# Patient Record
Sex: Male | Born: 1965 | Race: Black or African American | Hispanic: No | Marital: Married | Smoking: Never smoker
Health system: Southern US, Community
[De-identification: ages and names within clinical notes are randomized; demographics above are authoritative.]

## PROBLEM LIST (undated history)

## (undated) HISTORY — PX: HERNIA REPAIR: SHX51

---

## 2013-06-28 ENCOUNTER — Ambulatory Visit (INDEPENDENT_AMBULATORY_CARE_PROVIDER_SITE_OTHER): Admitting: *Deleted

## 2013-06-28 DIAGNOSIS — Z23 Encounter for immunization: Secondary | ICD-10-CM

## 2019-09-02 ENCOUNTER — Emergency Department (HOSPITAL_BASED_OUTPATIENT_CLINIC_OR_DEPARTMENT_OTHER)

## 2019-09-02 ENCOUNTER — Emergency Department (HOSPITAL_COMMUNITY)

## 2019-09-02 ENCOUNTER — Inpatient Hospital Stay (HOSPITAL_BASED_OUTPATIENT_CLINIC_OR_DEPARTMENT_OTHER)
Admission: EM | Admit: 2019-09-02 | Discharge: 2019-09-04 | DRG: 316 | Disposition: A | Discharge status: Home / Self Care | Attending: Cardiovascular Disease | Admitting: Cardiovascular Disease

## 2019-09-02 ENCOUNTER — Encounter (HOSPITAL_BASED_OUTPATIENT_CLINIC_OR_DEPARTMENT_OTHER): Payer: Self-pay | Admitting: *Deleted

## 2019-09-02 ENCOUNTER — Other Ambulatory Visit: Payer: Self-pay

## 2019-09-02 DIAGNOSIS — R10A2 Flank pain, left side: Secondary | ICD-10-CM

## 2019-09-02 DIAGNOSIS — I314 Cardiac tamponade: Secondary | ICD-10-CM

## 2019-09-02 DIAGNOSIS — I301 Infective pericarditis: Secondary | ICD-10-CM | POA: Diagnosis present

## 2019-09-02 DIAGNOSIS — I313 Pericardial effusion (noninflammatory): Secondary | ICD-10-CM | POA: Diagnosis present

## 2019-09-02 DIAGNOSIS — Z8619 Personal history of other infectious and parasitic diseases: Secondary | ICD-10-CM

## 2019-09-02 DIAGNOSIS — Z87442 Personal history of urinary calculi: Secondary | ICD-10-CM | POA: Diagnosis not present

## 2019-09-02 DIAGNOSIS — Z20822 Contact with and (suspected) exposure to covid-19: Secondary | ICD-10-CM | POA: Diagnosis present

## 2019-09-02 DIAGNOSIS — R109 Unspecified abdominal pain: Secondary | ICD-10-CM

## 2019-09-02 DIAGNOSIS — I3139 Other pericardial effusion (noninflammatory): Secondary | ICD-10-CM

## 2019-09-02 DIAGNOSIS — R9431 Abnormal electrocardiogram [ECG] [EKG]: Secondary | ICD-10-CM

## 2019-09-02 LAB — CBC WITH DIFFERENTIAL/PLATELET
Abs Immature Granulocytes: 0.02 10*3/uL (ref 0.00–0.07)
Basophils Absolute: 0 10*3/uL (ref 0.0–0.1)
Basophils Relative: 0 %
Eosinophils Absolute: 0 10*3/uL (ref 0.0–0.5)
Eosinophils Relative: 1 %
HCT: 34.6 % — ABNORMAL LOW (ref 39.0–52.0)
Hemoglobin: 11.1 g/dL — ABNORMAL LOW (ref 13.0–17.0)
Immature Granulocytes: 0 %
Lymphocytes Relative: 21 %
Lymphs Abs: 1.2 10*3/uL (ref 0.7–4.0)
MCH: 26.1 pg (ref 26.0–34.0)
MCHC: 32.1 g/dL (ref 30.0–36.0)
MCV: 81.2 fL (ref 80.0–100.0)
Monocytes Absolute: 1.1 10*3/uL — ABNORMAL HIGH (ref 0.1–1.0)
Monocytes Relative: 19 %
Neutro Abs: 3.4 10*3/uL (ref 1.7–7.7)
Neutrophils Relative %: 59 %
Platelets: 208 10*3/uL (ref 150–400)
RBC: 4.26 MIL/uL (ref 4.22–5.81)
RDW: 14.6 % (ref 11.5–15.5)
WBC: 5.8 10*3/uL (ref 4.0–10.5)
nRBC: 0 % (ref 0.0–0.2)

## 2019-09-02 LAB — COMPREHENSIVE METABOLIC PANEL
ALT: 37 U/L (ref 0–44)
AST: 27 U/L (ref 15–41)
Albumin: 3.3 g/dL — ABNORMAL LOW (ref 3.5–5.0)
Alkaline Phosphatase: 82 U/L (ref 38–126)
Anion gap: 9 (ref 5–15)
BUN: 12 mg/dL (ref 6–20)
CO2: 23 mmol/L (ref 22–32)
Calcium: 8.6 mg/dL — ABNORMAL LOW (ref 8.9–10.3)
Chloride: 101 mmol/L (ref 98–111)
Creatinine, Ser: 0.97 mg/dL (ref 0.61–1.24)
GFR calc Af Amer: >60 mL/min (ref >60)
GFR calc non Af Amer: >60 mL/min (ref >60)
Glucose, Bld: 94 mg/dL (ref 70–99)
Potassium: 4.2 mmol/L (ref 3.5–5.1)
Sodium: 133 mmol/L — ABNORMAL LOW (ref 135–145)
Total Bilirubin: 0.7 mg/dL (ref 0.3–1.2)
Total Protein: 7.1 g/dL (ref 6.5–8.1)

## 2019-09-02 LAB — SARS CORONAVIRUS 2 BY RT PCR (HOSPITAL ORDER, PERFORMED IN ~~LOC~~ HOSPITAL LAB): SARS Coronavirus 2: NEGATIVE

## 2019-09-02 LAB — C-REACTIVE PROTEIN: CRP: 22.5 mg/dL — ABNORMAL HIGH (ref <1.0)

## 2019-09-02 LAB — LACTIC ACID, PLASMA: Lactic Acid, Venous: 1.2 mmol/L (ref 0.5–1.9)

## 2019-09-02 LAB — LIPASE, BLOOD: Lipase: 15 U/L (ref 11–51)

## 2019-09-02 LAB — SEDIMENTATION RATE: Sed Rate: 69 mm/hr — ABNORMAL HIGH (ref 0–16)

## 2019-09-02 LAB — HIV ANTIBODY (ROUTINE TESTING W REFLEX): HIV Screen 4th Generation wRfx: NONREACTIVE

## 2019-09-02 LAB — ECHOCARDIOGRAM COMPLETE
Height: 69 in
Weight: 2944 oz

## 2019-09-02 LAB — TROPONIN I (HIGH SENSITIVITY): Troponin I (High Sensitivity): 26 ng/L — ABNORMAL HIGH (ref <18)

## 2019-09-02 MED ORDER — IBUPROFEN 200 MG PO TABS
800.0000 mg | ORAL_TABLET | Freq: Three times a day (TID) | ORAL | Status: DC
  Administered 2019-09-02 – 2019-09-04 (×3): 800 mg via ORAL
  Filled 2019-09-02: qty 4
  Filled 2019-09-02: qty 1
  Filled 2019-09-02: qty 4

## 2019-09-02 MED ORDER — COLCHICINE 0.6 MG PO TABS
0.6000 mg | ORAL_TABLET | Freq: Two times a day (BID) | ORAL | Status: DC
  Administered 2019-09-02 – 2019-09-04 (×3): 0.6 mg via ORAL
  Filled 2019-09-02 (×3): qty 1

## 2019-09-02 MED ORDER — SODIUM CHLORIDE 0.9% FLUSH
3.0000 mL | Freq: Two times a day (BID) | INTRAVENOUS | Status: DC
  Administered 2019-09-02 – 2019-09-03 (×2): 3 mL via INTRAVENOUS

## 2019-09-02 MED ORDER — PANTOPRAZOLE SODIUM 40 MG PO TBEC
40.0000 mg | DELAYED_RELEASE_TABLET | Freq: Every day | ORAL | Status: DC
  Administered 2019-09-02 – 2019-09-04 (×2): 40 mg via ORAL
  Filled 2019-09-02 (×2): qty 1

## 2019-09-02 NOTE — ED Notes (Signed)
ED Provider at bedside. 

## 2019-09-02 NOTE — Consult Note (Addendum)
Cardiology Consultation:   Patient ID: Craig Benjamin MRN: [id number]; DOB: 1965-10-24  Admit date: 09/02/2019 Date of Consult: 09/02/2019  Primary Care Provider: Gillian Scarce, MD Primary Cardiologist: No primary care provider on file. New Primary Electrophysiologist:  None    Patient Profile:   Craig Benjamin is a 54 y.o. male with a hx of generally good health who is being seen today for the evaluation of pericarditis at the request of Dr. Freida Busman.  History of Present Illness:   Craig Benjamin began experiencing symptoms suggestive of a viral illness (low-grade elevation in temperature, body aches) approximately 4-5 days ago.  He did not have a history of COVID-19 exposure and his Covid test was negative.  Later he began developing an aching discomfort in his lower posterior left thorax worsened by lying flat in bed or by taking a very deep breath.  It was vaguely reminiscent of previous problems with nephrolithiasis so he went to have an abdominal CT.  This did not show any evidence of nephrolithiasis but reportedly showed presence of a pericardial effusion (the study was performed at [facility] and cannot be reviewed).  An ECG was performed at [facility] and shows 2 mm ST segment elevation in virtually all ECG leads, strongly suggestive of acute pericarditis.  He has not had cough, shaking chills, hemoptysis, leg swelling, syncope or presyncope, palpitations, dizziness or lightheadedness, history of chest trauma or gastrointestinal symptoms.  He does not have any other symptoms to suggest autoimmune or collagen vascular disease.  He has noted recent shortness of breath and thinks it might be a little difficult to climb a flight of stairs, weather is otherwise he is in good physical shape.  He lives a very healthy lifestyle with a healthy diet and exercises 6 days a week (3 days cardio, 3 days weights).  Heart Pathway Score:     History reviewed. No pertinent past medical history.  Kidney stone 2018. He has a history of chickenpox, mildly elevated PSA, HSV-2 infection.  Past Surgical History:  Procedure Laterality Date  . HERNIA REPAIR       Home Medications:  Prior to Admission medications   Medication Sig Start Date End Date Taking? Authorizing Provider  sulfamethoxazole-trimethoprim (BACTRIM DS) 800-160 MG tablet Take 1 tablet by mouth 2 (two) times daily.   Yes [provider]    Inpatient Medications: Scheduled Meds:  Continuous Infusions:  PRN Meds:   Allergies:   No Known Allergies  Social History:   Social History   Socioeconomic History  . Marital status: Married    Spouse name: Not on file  . Number of children: Not on file  . Years of education: Not on file  . Highest education level: Not on file  Occupational History  . Not on file  Tobacco Use  . Smoking status: Never Smoker  . Smokeless tobacco: Never Used  Substance and Sexual Activity  . Alcohol use: Not Currently    Comment: occ  . Drug use: Not on file  . Sexual activity: Not on file  Other Topics Concern  . Not on file  Social History Narrative  . Not on file   Social Determinants of Health   Financial Resource Strain:   . Difficulty of Paying Living Expenses: Not on file  Food Insecurity:   . Worried About Programme researcher, broadcasting/film/video in the Last Year: Not on file  . Ran Out of Food in the Last Year: Not on file  Transportation Needs:   .  Lack of Transportation (Medical): Not on file  . Lack of Transportation (Non-Medical): Not on file  Physical Activity:   . Days of Exercise per Week: Not on file  . Minutes of Exercise per Session: Not on file  Stress:   . Feeling of Stress : Not on file  Social Connections:   . Frequency of Communication with Friends and Family: Not on file  . Frequency of Social Gatherings with Friends and Family: Not on file  . Attends Religious Services: Not on file  . Active Member of Clubs or Organizations: Not on file  . Attends Occupational hygienist Meetings: Not on file  . Marital Status: Not on file  Intimate Partner Violence:   . Fear of Current or Ex-Partner: Not on file  . Emotionally Abused: Not on file  . Physically Abused: Not on file  . Sexually Abused: Not on file    Family History:   There is no family history of premature onset cardiac illness   ROS:  Please see the history of present illness.   All other ROS reviewed and negative.     Physical Exam/Data:   Vitals:   09/02/19 1540 09/02/19 1610 09/02/19 1719 09/02/19 1723  BP: (!) 124/95 (!) 123/99 (!) 131/98   Pulse: 94 99 95 96  Resp: (!) 24 (!) 23  (!) 29  Temp:      TempSrc:      SpO2: 96% 99% 100% 100%  Weight:      Height:       No intake or output data in the 24 hours ending 09/02/19 1842 Last 3 Weights 09/02/2019  Weight (lbs) 184 lb  Weight (kg) 83.462 kg     Body mass index is 27.17 kg/m.  General:  Well nourished, well developed, in no acute distress, he is minimally overweight, also appears fairly muscular and fit HEENT: normal Lymph: no adenopathy Neck: no JVD Endocrine:  No thryomegaly Vascular: No carotid bruits; FA pulses 2+ bilaterally without bruits  Cardiac:  normal S1, S2; RRR; no murmur no pericardial rub is heard Lungs:  clear to auscultation bilaterally, no wheezing, rhonchi or rales  Abd: soft, nontender, no hepatomegaly  Ext: no edema Musculoskeletal:  No deformities, BUE and BLE strength normal and equal Skin: warm and dry  Neuro:  CNs 2-12 intact, no focal abnormalities noted Psych:  Normal affect   EKG:  The EKG was personally reviewed and demonstrates: Sinus rhythm and diffuse ST segment elevation, upwardly concave, consistent with acute pericarditis Telemetry:  Telemetry was personally reviewed and demonstrates: Sinus rhythm  Relevant CV Studies: Echo has been ordered and is pending  Laboratory Data:  High Sensitivity Troponin:   Recent Labs  Lab 09/02/19 1545  TROPONINIHS 26*      Chemistry Recent Labs  Lab 09/02/19 1545  NA 133*  K 4.2  CL 101  CO2 23  GLUCOSE 94  BUN 12  CREATININE 0.97  CALCIUM 8.6*  GFRNONAA >60  GFRAA >60  ANIONGAP 9    Recent Labs  Lab 09/02/19 1545  PROT 7.1  ALBUMIN 3.3*  AST 27  ALT 37  ALKPHOS 82  BILITOT 0.7   Hematology Recent Labs  Lab 09/02/19 1545  WBC 5.8  RBC 4.26  HGB 11.1*  HCT 34.6*  MCV 81.2  MCH 26.1  MCHC 32.1  RDW 14.6  PLT 208   BNPNo results for input(s): BNP, PROBNP in the last 168 hours.  DDimer No results for input(s): DDIMER in  the last 168 hours.   Radiology/Studies:  DG Chest Port 1 View  Result Date: 09/02/2019 CLINICAL DATA:  Pericardial effusion.  Fever and body aches. EXAM: PORTABLE CHEST 1 VIEW COMPARISON:  None. FINDINGS: The cardiac silhouette is enlarged. Opacity in left retrocardiac region obscures the left hemidiaphragm. The hila and mediastinum are normal. No pneumothorax. No nodules or masses. No other acute abnormalities. IMPRESSION: The cardiac silhouette is enlarged which could represent cardiomegaly or a pericardial effusion given history. Opacity in left retrocardiac region may simply represent atelectasis. A PA and lateral chest x-ray could better evaluate. Electronically Signed   By: Dorise Bullion III M.D   On: 09/02/2019 16:19   {  Assessment and Plan:   1. Acute pericarditis: Based on prodrome, this is very likely to be viral pericarditis and will have a self-limited course.  We will assess the quantity of pericardial fluid and any possible hemodynamic consequences with a stat echocardiogram.  If there is no evidence of hemodynamic risk, will treat with NSAIDs and colchicine.  If he has a large pericardial effusions with signs of impaired cardiac filling, will admit to the hospital.  We will avoid NSAIDs if there is evidence of regional wall motion abnormalities that could suggest myocarditis, but this is unlikely with normal cardiac enzymes.   ADDENDUM: Bedside  review of the echocardiogram shows a very large circumferential pericardial effusion with multiple echocardiographic signs of tamponade (right ventricular early diastolic collapse, marked increase in respiratory variation in the mitral and tricuspid inflow, dilated inferior vena cava).  He remains hemodynamically compensated with normal blood pressure and only mild resting tachycardia.  There is no indication for immediate pericardiocentesis, but I warned him that this might be decided upon tomorrow.  I am particularly worried that he is only been ill for about 4 to 5 days which means the effusion has accumulated very quickly and is likely to continue enlarging fast.  Briefly discussed the pros and cons of pericardiocentesis.     For questions or updates, please contact [place] Please consult [email or web address] for contact info under     Signed, Sanda Klein, MD  09/02/2019 6:42 PM

## 2019-09-02 NOTE — Progress Notes (Signed)
  Echocardiogram 2D Echocardiogram has been performed.  Delcie Roch 09/02/2019, 7:25 PM

## 2019-09-02 NOTE — ED Notes (Signed)
Cardiology at bedside.

## 2019-09-02 NOTE — ED Notes (Signed)
Report to [facility] by [facility] RN.

## 2019-09-02 NOTE — ED Provider Notes (Signed)
Patient transfer from [facility] due to the pericardial effusion.  He has been seen by cardiology.  Spoke with cardiology here and they will see him.  He is stable at this point.   Lorre Nick, MD 09/02/19 1729

## 2019-09-02 NOTE — ED Provider Notes (Addendum)
[facility] Emergency Department Provider Note MRN:  [id number]  Arrival date & time: 09/02/19     Chief Complaint   Flank pain History of Present Illness   Craig Benjamin is a 54 y.o. year-old male with no pertinent past medical history presenting to the ED with chief complaint of flank pain.  Patient began experiencing fever, body aches about 5 days ago, thought he had COVID-19.  He was tested for Covid and it was negative.  He then began experiencing more localized left flank pain that he thought felt similar to her prior kidney stone.  He went to urology today, who performed CT scan as an outpatient which revealed pericardial effusion.  He is advised to come to the emergency department for evaluation.  Review of Systems  A complete 10 system review of systems was obtained and all systems are negative except as noted in the HPI and PMH.   Patient's Health History   History reviewed. No pertinent past medical history.  Past Surgical History:  Procedure Laterality Date  . HERNIA REPAIR      No family history on file.  Social History   Socioeconomic History  . Marital status: Married    Spouse name: Not on file  . Number of children: Not on file  . Years of education: Not on file  . Highest education level: Not on file  Occupational History  . Not on file  Tobacco Use  . Smoking status: Never Smoker  . Smokeless tobacco: Never Used  Substance and Sexual Activity  . Alcohol use: Not Currently    Comment: occ  . Drug use: Not on file  . Sexual activity: Not on file  Other Topics Concern  . Not on file  Social History Narrative  . Not on file   Social Determinants of Health   Financial Resource Strain:   . Difficulty of Paying Living Expenses: Not on file  Food Insecurity:   . Worried About Programme researcher, broadcasting/film/video in the Last Year: Not on file  . Ran Out of Food in the Last Year: Not on file  Transportation Needs:   . Lack of  Transportation (Medical): Not on file  . Lack of Transportation (Non-Medical): Not on file  Physical Activity:   . Days of Exercise per Week: Not on file  . Minutes of Exercise per Session: Not on file  Stress:   . Feeling of Stress : Not on file  Social Connections:   . Frequency of Communication with Friends and Family: Not on file  . Frequency of Social Gatherings with Friends and Family: Not on file  . Attends Religious Services: Not on file  . Active Member of Clubs or Organizations: Not on file  . Attends Banker Meetings: Not on file  . Marital Status: Not on file  Intimate Partner Violence:   . Fear of Current or Ex-Partner: Not on file  . Emotionally Abused: Not on file  . Physically Abused: Not on file  . Sexually Abused: Not on file     Physical Exam  Vital Signs and Nursing Notes reviewed Vitals:   09/02/19 1522 09/02/19 1540  BP: (!) 144/97 (!) 124/95  Pulse: 98 94  Resp: 18 (!) 24  Temp: 99.1 F (37.3 C)   SpO2: 99% 96%    CONSTITUTIONAL: Well-appearing, NAD NEURO:  Alert and oriented x 3, no focal deficits EYES:  eyes equal and reactive ENT/NECK:  no LAD, no JVD CARDIO: Regular  rate, well-perfused, normal S1 and S2 PULM:  CTAB no wheezing or rhonchi GI/GU:  normal bowel sounds, non-distended, non-tender MSK/SPINE:  No gross deformities, no edema SKIN:  no rash, atraumatic PSYCH:  Appropriate speech and behavior  Diagnostic and Interventional Summary    EKG Interpretation  Date/Time:  Monday September 02 2019 15:31:42 EST Ventricular Rate:  95 PR Interval:    QRS Duration: 97 QT Interval:  333 QTC Calculation: 419 R Axis:   79 Text Interpretation: Sinus rhythm diffuse st elevation No previous ECGs available Confirmed by Gerlene Fee [phone]) on 09/02/2019 4:03:50 PM      Labs Reviewed  CBC WITH DIFFERENTIAL/PLATELET - Abnormal; Notable for the following components:      Result Value   Hemoglobin 11.1 (*)    HCT 34.6 (*)     Monocytes Absolute 1.1 (*)    All other components within normal limits  COMPREHENSIVE METABOLIC PANEL  LACTIC ACID, PLASMA  LIPASE, BLOOD  TROPONIN I (HIGH SENSITIVITY)    DG Chest Port 1 View    (Results Pending)    Medications - No data to display   Procedures  /  Critical Care .Critical Care Performed by: Maudie Flakes, MD Authorized by: Maudie Flakes, MD   Critical care provider statement:    Critical care time (minutes):  33   Critical care was necessary to treat or prevent imminent or life-threatening deterioration of the following conditions:  Cardiac failure (Pericardial effusion with concern for tamponade physiology)   Critical care was time spent personally by me on the following activities:  Discussions with consultants, evaluation of patient's response to treatment, examination of patient, ordering and performing treatments and interventions, ordering and review of laboratory studies, ordering and review of radiographic studies, pulse oximetry, re-evaluation of patient's condition, obtaining history from patient or surrogate and review of old charts Ultrasound ED Abd  Date/Time: 09/02/2019 4:04 PM Performed by: Maudie Flakes, MD Authorized by: Maudie Flakes, MD   Procedure details:    Indications: flank pain     Scope of abdominal ultrasound: to evaluate for evidence of dissection or aneurysm.   Aorta:  Visualized   Images: archived    Vascular findings:    Aorta: aorta normal (< 3cm)   Comments:     Normal appearing aorta Ultrasound ED Echo  Date/Time: 09/02/2019 4:05 PM Performed by: Maudie Flakes, MD Authorized by: Maudie Flakes, MD   Procedure details:    Indications comment:  Flank pain   Views: parasternal long axis view, parasternal short axis view and apical 4 chamber view     Images: archived   Findings:    Pericardium: large pericardial effusion and RV collapse/tamponade     LV Function: normal (>50% EF)   Impression:    Impression:  pericardial effusion present      ED Course and Medical Decision Making  I have reviewed the triage vital signs, the nursing notes, and pertinent available records from the EMR.  Pertinent labs & imaging results that were available during my care of the patient were reviewed by me and considered in my medical decision making (see below for details).     Patient sent here with concern for pericardial effusion.  He has normal vital signs and is well-appearing in no acute distress, initially denied having any chest pain.  Initial EKG demonstrating diffuse ST elevations.  Cardiology immediately paged for consultation.  Felt that this was unlikely to be a traditional STEMI given the  lack of chest pain and evidence of pericardial effusion.  Effusion was confirmed on bedside ultrasound, see note above.  There was concern for RV collapse or trampoline sign during my evaluation.  Discussed with Dr. Clifton James cardiology, who agrees with avoiding STEMI initiation but does want ER to ER transfer to [facility] for emergent cardiology evaluation, question need for formal echo versus pericardiocentesis versus cardiac cath.  Had also considered CTA of the chest and abdomen to exclude aortic pathology, discussed this option with cardiology, will defer testing to providers at [facility] given her ability to rapidly transport.  Accepting ER physician Dr. Renaye Rakers.    Elmer Sow. Pilar Plate, MD [facility] Emergency Medicine [facility] [email or web address]  Final Clinical Impressions(s) / ED Diagnoses     ICD-10-CM   1. Pericardial effusion  I31.3   2. Abnormal EKG  R94.31   3. Left flank pain  R10.9     ED Discharge Orders    None       Discharge Instructions Discussed with and Provided to Patient:   Discharge Instructions   None       Sabas Sous, MD 09/02/19 1608    Sabas Sous, MD 09/02/19 [phone]

## 2019-09-02 NOTE — ED Triage Notes (Signed)
Pt c/o fever body aches x 5 days , seen % days ago for same Covid neg, CT flank this am , sent here for abs reading " fluid on heart"

## 2019-09-02 NOTE — ED Triage Notes (Signed)
Pt arrives to ED from [facility] with [facility] with complaints of an outpatient CT scan that reveled a pericardial effusion and confirmed with bedside ultrasound by MD Bero. Patient denies chest pain or shortness of breath.

## 2019-09-03 ENCOUNTER — Inpatient Hospital Stay (HOSPITAL_COMMUNITY)

## 2019-09-03 ENCOUNTER — Inpatient Hospital Stay (HOSPITAL_COMMUNITY)
Admission: EM | Disposition: A | Discharge status: Home / Self Care | Payer: Self-pay | Source: Home / Self Care | Attending: Cardiovascular Disease

## 2019-09-03 DIAGNOSIS — I314 Cardiac tamponade: Secondary | ICD-10-CM

## 2019-09-03 DIAGNOSIS — I313 Pericardial effusion (noninflammatory): Secondary | ICD-10-CM

## 2019-09-03 HISTORY — PX: PERICARDIOCENTESIS: CATH118255

## 2019-09-03 LAB — BODY FLUID CELL COUNT WITH DIFFERENTIAL
Eos, Fluid: 1 %
Lymphs, Fluid: 31 %
Monocyte-Macrophage-Serous Fluid: 14 % — ABNORMAL LOW (ref 50–90)
Neutrophil Count, Fluid: 54 % — ABNORMAL HIGH (ref 0–25)
Total Nucleated Cell Count, Fluid: 628 cu mm (ref 0–1000)

## 2019-09-03 LAB — MRSA PCR SCREENING: MRSA by PCR: NEGATIVE

## 2019-09-03 LAB — GRAM STAIN: Gram Stain: NONE SEEN

## 2019-09-03 LAB — ECHOCARDIOGRAM LIMITED
Height: 69 in
Weight: 2926.4 oz

## 2019-09-03 SURGERY — PERICARDIOCENTESIS
Anesthesia: LOCAL

## 2019-09-03 MED ORDER — MIDAZOLAM HCL 2 MG/2ML IJ SOLN
INTRAMUSCULAR | Status: AC
  Filled 2019-09-03: qty 2

## 2019-09-03 MED ORDER — FENTANYL CITRATE (PF) 100 MCG/2ML IJ SOLN
INTRAMUSCULAR | Status: AC
  Filled 2019-09-03: qty 2

## 2019-09-03 MED ORDER — SODIUM CHLORIDE 0.9% FLUSH: 3.0000 mL | Freq: Two times a day (BID) | INTRAVENOUS | Status: DC

## 2019-09-03 MED ORDER — LIDOCAINE HCL (PF) 1 % IJ SOLN
INTRAMUSCULAR | Status: AC
  Filled 2019-09-03: qty 30

## 2019-09-03 MED ORDER — SODIUM CHLORIDE 0.9 % IV SOLN: 250.0000 mL | INTRAVENOUS | Status: DC | PRN

## 2019-09-03 MED ORDER — FENTANYL CITRATE (PF) 100 MCG/2ML IJ SOLN
INTRAMUSCULAR | Status: DC | PRN
  Administered 2019-09-03 (×2): 25 ug via INTRAVENOUS
  Administered 2019-09-03 (×2): 50 ug via INTRAVENOUS

## 2019-09-03 MED ORDER — SODIUM CHLORIDE 0.9 % IV SOLN
INTRAVENOUS | Status: AC | PRN
  Administered 2019-09-03: 10 mL/h via INTRAVENOUS

## 2019-09-03 MED ORDER — LIDOCAINE HCL (PF) 1 % IJ SOLN
INTRAMUSCULAR | Status: DC | PRN
  Administered 2019-09-03: 30 mL

## 2019-09-03 MED ORDER — OXYCODONE-ACETAMINOPHEN 5-325 MG PO TABS
1.0000 | ORAL_TABLET | Freq: Four times a day (QID) | ORAL | Status: DC | PRN
  Administered 2019-09-03: 2 via ORAL
  Filled 2019-09-03: qty 2

## 2019-09-03 MED ORDER — CHLORHEXIDINE GLUCONATE CLOTH 2 % EX PADS
6.0000 | MEDICATED_PAD | Freq: Every day | CUTANEOUS | Status: DC
  Administered 2019-09-03: 6 via TOPICAL

## 2019-09-03 MED ORDER — SODIUM CHLORIDE 0.9% FLUSH: 3.0000 mL | INTRAVENOUS | Status: DC | PRN

## 2019-09-03 MED ORDER — MORPHINE SULFATE (PF) 2 MG/ML IV SOLN
2.0000 mg | INTRAVENOUS | Status: DC | PRN
  Administered 2019-09-04: 2 mg via INTRAVENOUS
  Filled 2019-09-03: qty 1

## 2019-09-03 MED ORDER — MORPHINE SULFATE (PF) 10 MG/ML IV SOLN: 2.0000 mg | INTRAVENOUS | Status: DC | PRN

## 2019-09-03 MED ORDER — MIDAZOLAM HCL 2 MG/2ML IJ SOLN
INTRAMUSCULAR | Status: DC | PRN
  Administered 2019-09-03: 1 mg via INTRAVENOUS

## 2019-09-03 MED ORDER — MIDAZOLAM HCL 2 MG/2ML IJ SOLN
INTRAMUSCULAR | Status: DC | PRN
  Administered 2019-09-03: 1 mg via INTRAVENOUS
  Administered 2019-09-03: 2 mg via INTRAVENOUS

## 2019-09-03 MED ORDER — HYDRALAZINE HCL 20 MG/ML IJ SOLN: 10.0000 mg | INTRAMUSCULAR | Status: AC | PRN

## 2019-09-03 MED ORDER — HEPARIN (PORCINE) IN NACL 1000-0.9 UT/500ML-% IV SOLN
INTRAVENOUS | Status: DC | PRN
  Administered 2019-09-03: 500 mL

## 2019-09-03 SURGICAL SUPPLY — 2 items
PACK CARDIAC CATHETERIZATION (CUSTOM PROCEDURE TRAY) ×1 IMPLANT
PERIVAC PERICARDIOCENTESIS 8.3 (TRAY / TRAY PROCEDURE) ×1 IMPLANT

## 2019-09-03 NOTE — Plan of Care (Signed)

## 2019-09-03 NOTE — H&P (Signed)
Please review 09/03/2019 H&P labeled "consult note".   Cardiology Consultation:  Patient ID: Craig Benjamin  MRN: [id number]; DOB: 06/28/1966  Admit date: 09/02/2019  Date of Consult: 09/02/2019  Primary Care Provider: Jonathon Resides, MD  Primary Cardiologist: No primary care provider on file. New  Primary Electrophysiologist: None  Patient Profile:  Craig Benjamin is a 54 y.o. male with a hx of generally good health who is being seen today for the evaluation of pericarditis at the request of Dr. Zenia Resides.  History of Present Illness:  Mr. Boivin began experiencing symptoms suggestive of a viral illness (low-grade elevation in temperature, body aches) approximately 4-5 days ago. He did not have a history of COVID-19 exposure and his Covid test was negative. Later he began developing an aching discomfort in his lower posterior left thorax worsened by lying flat in bed or by taking a very deep breath. It was vaguely reminiscent of previous problems with nephrolithiasis so he went to have an abdominal CT. This did not show any evidence of nephrolithiasis but reportedly showed presence of a pericardial effusion (the study was performed at [facility] and cannot be reviewed). An ECG was performed at [facility] and shows 2 mm ST segment elevation in virtually all ECG leads, strongly suggestive of acute pericarditis.  He has not had cough, shaking chills, hemoptysis, leg swelling, syncope or presyncope, palpitations, dizziness or lightheadedness, history of chest trauma or gastrointestinal symptoms. He does not have any other symptoms to suggest autoimmune or collagen vascular disease.  He has noted recent shortness of breath and thinks it might be a little difficult to climb a flight of stairs, weather is otherwise he is in good physical shape.  He lives a very healthy lifestyle with a healthy diet and exercises 6 days a week (3 days cardio, 3 days weights).  Heart Pathway Score:  History reviewed. No  pertinent past medical history. Kidney stone 2018. He has a history of chickenpox, mildly elevated PSA, HSV-2 infection.       Past Surgical History:  Procedure Laterality Date  . HERNIA REPAIR     Home Medications:         Prior to Admission medications   Medication Sig Start Date End Date Taking? Authorizing Provider  sulfamethoxazole-trimethoprim (BACTRIM DS) 800-160 MG tablet Take 1 tablet by mouth 2 (two) times daily.   Yes [provider]  Inpatient Medications:  Scheduled Meds:  Continuous Infusions:  PRN Meds:  Allergies: No Known Allergies  Social History:  Social History        Socioeconomic History  . Marital status: Married    Spouse name: Not on file  . Number of children: Not on file  . Years of education: Not on file  . Highest education level: Not on file  Occupational History  . Not on file  Tobacco Use  . Smoking status: Never Smoker  . Smokeless tobacco: Never Used  Substance and Sexual Activity  . Alcohol use: Not Currently    Comment: occ  . Drug use: Not on file  . Sexual activity: Not on file  Other Topics Concern  . Not on file  Social History Narrative  . Not on file   Social Determinants of Health      Financial Resource Strain:   . Difficulty of Paying Living Expenses: Not on file  Food Insecurity:   . Worried About Charity fundraiser in the Last Year: Not on file  . Ran Out of Food  in the Last Year: Not on file  Transportation Needs:   . Lack of Transportation (Medical): Not on file  . Lack of Transportation (Non-Medical): Not on file  Physical Activity:   . Days of Exercise per Week: Not on file  . Minutes of Exercise per Session: Not on file  Stress:   . Feeling of Stress : Not on file  Social Connections:   . Frequency of Communication with Friends and Family: Not on file  . Frequency of Social Gatherings with Friends and Family: Not on file  . Attends Religious Services: Not on file  . Active Member of Clubs or  Organizations: Not on file  . Attends Banker Meetings: Not on file  . Marital Status: Not on file  Intimate Partner Violence:   . Fear of Current or Ex-Partner: Not on file  . Emotionally Abused: Not on file  . Physically Abused: Not on file  . Sexually Abused: Not on file   Family History:  There is no family history of premature onset cardiac illness  ROS:  Please see the history of present illness.  All other ROS reviewed and negative.  Physical Exam/Data:         Vitals:   09/02/19 1540 09/02/19 1610 09/02/19 1719 09/02/19 1723  BP: (!) 124/95 (!) 123/99 (!) 131/98   Pulse: 94 99 95 96  Resp: (!) 24 (!) 23  (!) 29  Temp:      TempSrc:      SpO2: 96% 99% 100% 100%  Weight:      Height:       No intake or output data in the 24 hours ending 09/02/19 1842  Last 3 Weights 09/02/2019  Weight (lbs) 184 lb  Weight (kg) 83.462 kg   Body mass index is 27.17 kg/m.  General: Well nourished, well developed, in no acute distress, he is minimally overweight, also appears fairly muscular and fit  HEENT: normal  Lymph: no adenopathy  Neck: no JVD  Endocrine: No thryomegaly  Vascular: No carotid bruits; FA pulses 2+ bilaterally without bruits  Cardiac: normal S1, S2; RRR; no murmur no pericardial rub is heard  Lungs: clear to auscultation bilaterally, no wheezing, rhonchi or rales  Abd: soft, nontender, no hepatomegaly  Ext: no edema  Musculoskeletal: No deformities, BUE and BLE strength normal and equal  Skin: warm and dry  Neuro: CNs 2-12 intact, no focal abnormalities noted  Psych: Normal affect  EKG: The EKG was personally reviewed and demonstrates: Sinus rhythm and diffuse ST segment elevation, upwardly concave, consistent with acute pericarditis  Telemetry: Telemetry was personally reviewed and demonstrates: Sinus rhythm  Relevant CV Studies:  Echo has been ordered and is pending  Laboratory Data:  High Sensitivity Troponin:  Last Labs                   Chemistry  Last Labs                                                           Last Labs                                      Hematology  Last Labs  BNP  Last Labs     DDimer  Last Labs     Radiology/Studies:  DG Chest Port 1 View  Result Date: 09/02/2019  CLINICAL DATA: Pericardial effusion. Fever and body aches. EXAM: PORTABLE CHEST 1 VIEW COMPARISON: None. FINDINGS: The cardiac silhouette is enlarged. Opacity in left retrocardiac region obscures the left hemidiaphragm. The hila and mediastinum are normal. No pneumothorax. No nodules or masses. No other acute abnormalities. IMPRESSION: The cardiac silhouette is enlarged which could represent cardiomegaly or a pericardial effusion given history. Opacity in left retrocardiac region may simply represent atelectasis. A PA and lateral chest x-ray could better evaluate. Electronically Signed By: Gerome Sam III M.D On: 09/02/2019 16:19   {  Assessment and Plan:  1. Acute pericarditis: Based on prodrome, this is very likely to be viral pericarditis and will have a self-limited course. We will assess the quantity of pericardial fluid and any possible hemodynamic consequences with a stat echocardiogram. If there is no evidence of hemodynamic risk, will treat with NSAIDs and colchicine. If he has a large pericardial effusions with signs of impaired cardiac filling, will admit to the hospital. We will avoid NSAIDs if there is evidence of regional wall motion abnormalities that could suggest myocarditis, but this is unlikely with normal cardiac enzymes. ADDENDUM: Bedside review of the echocardiogram shows a very large circumferential pericardial effusion with multiple echocardiographic signs of tamponade (right ventricular early diastolic collapse, marked increase in respiratory variation in the mitral and tricuspid inflow, dilated inferior vena cava).  He remains  hemodynamically compensated with normal blood pressure and only mild resting tachycardia. There is no indication for immediate pericardiocentesis, but I warned him that this might be decided upon tomorrow. I am particularly worried that he is only been ill for about 4 to 5 days which means the effusion has accumulated very quickly and is likely to continue enlarging fast. Briefly discussed the pros and cons of pericardiocentesis.  For questions or updates, please contact [facility]  Please consult [email or web address] for contact info under  Signed,  Thurmon Fair, MD  09/02/2019 6:42 PM

## 2019-09-03 NOTE — Progress Notes (Signed)
2D Echocardiogram has been performed.  Craig Benjamin Amariah Kierstead 09/03/2019, 8:16 AM

## 2019-09-03 NOTE — Progress Notes (Addendum)
Progress Note  Patient Name: Craig Benjamin Date of Encounter: 09/03/2019  Primary Cardiologist: New to [facility]  Subjective   Pt feeling ok today. Pain with laying flat. Repeat echocardiogram performed at bedside this AM.   Inpatient Medications    Scheduled Meds: . colchicine  0.6 mg Oral BID  . ibuprofen  800 mg Oral TID  . pantoprazole  40 mg Oral Daily  . sodium chloride flush  3 mL Intravenous Q12H   Continuous Infusions:  PRN Meds:   Vital Signs    Vitals:   09/02/19 2000 09/02/19 2030 09/02/19 2054 09/03/19 0416  BP:  128/86 135/77 122/83  Pulse: 91  96   Resp: (!) 28 (!) 21  16  Temp:   98.7 F (37.1 C) 98.4 F (36.9 C)  TempSrc:   Oral Oral  SpO2: 97%  100% 100%  Weight:   84 kg 83 kg  Height:        Intake/Output Summary (Last 24 hours) at 09/03/2019 0741 Last data filed at 09/02/2019 2118 Gross per 24 hour  Intake 243 ml  Output --  Net 243 ml   Filed Weights   09/02/19 1520 09/02/19 2054 09/03/19 0416  Weight: 83.5 kg 84 kg 83 kg    Physical Exam   General: Well developed, well nourished, NAD Neck: Negative for carotid bruits. No JVD Lungs:Clear to ausculation bilaterally. No wheezes. Breathing is unlabored. Cardiovascular: RRR with S1 S2. No murmurs, rubs.  Abdomen: Soft, non-tender, non-distended. No obvious abdominal masses. Extremities: No edema. DP pulses 2+ bilaterally Neuro: Alert and oriented. No focal deficits. No facial asymmetry. MAE spontaneously. Psych: Responds to questions appropriately with normal affect.    Labs    Chemistry Recent Labs  Lab 09/02/19 1545  NA 133*  K 4.2  CL 101  CO2 23  GLUCOSE 94  BUN 12  CREATININE 0.97  CALCIUM 8.6*  PROT 7.1  ALBUMIN 3.3*  AST 27  ALT 37  ALKPHOS 82  BILITOT 0.7  GFRNONAA >60  GFRAA >60  ANIONGAP 9     Hematology Recent Labs  Lab 09/02/19 1545  WBC 5.8  RBC 4.26  HGB 11.1*  HCT 34.6*  MCV 81.2  MCH 26.1  MCHC 32.1  RDW 14.6  PLT 208    Cardiac  EnzymesNo results for input(s): TROPONINI in the last 168 hours. No results for input(s): TROPIPOC in the last 168 hours.   BNPNo results for input(s): BNP, PROBNP in the last 168 hours.   DDimer No results for input(s): DDIMER in the last 168 hours.   Radiology    DG Chest Port 1 View  Result Date: 09/02/2019 CLINICAL DATA:  Pericardial effusion.  Fever and body aches. EXAM: PORTABLE CHEST 1 VIEW COMPARISON:  None. FINDINGS: The cardiac silhouette is enlarged. Opacity in left retrocardiac region obscures the left hemidiaphragm. The hila and mediastinum are normal. No pneumothorax. No nodules or masses. No other acute abnormalities. IMPRESSION: The cardiac silhouette is enlarged which could represent cardiomegaly or a pericardial effusion given history. Opacity in left retrocardiac region may simply represent atelectasis. A PA and lateral chest x-ray could better evaluate. Electronically Signed   By: Dorise Bullion III M.D   On: 09/02/2019 16:19   ECHOCARDIOGRAM COMPLETE  Result Date: 09/02/2019   ECHOCARDIOGRAM REPORT   Patient Name:   Craig Benjamin Date of Exam: 09/02/2019 Medical Rec #:  [id number]      Height:       69.0 in Accession #:  [id number]     Weight:       184.0 lb Date of Birth:  April 21, 1966      BSA:          1.99 m Patient Age:    54 years       BP:           131/98 mmHg Patient Gender: M              HR:           86 bpm. Exam Location:  Inpatient Procedure: 2D Echo STAT ECHO Indications:    pericardial effusion 423.9  History:        Patient has no prior history of Echocardiogram examinations.  Sonographer:    Johny Chess Referring Phys: [place]  1. Left ventricular ejection fraction, by visual estimation, is 60 to 65%. The left ventricle has normal function. There is no left ventricular hypertrophy.  2. The left ventricle has no regional wall motion abnormalities.  3. Global right ventricle has normal systolic function.The right ventricular size is  normal. No increase in right ventricular wall thickness.  4. Left atrial size was normal.  5. Right atrial size was normal.  6. Findings are consistent with cardiac tamponade.  7. Large pericardial effusion.  8. The pericardial effusion is circumferential.  9. There is inversion of the right ventricular wall, inversion of the right atrial wall, excessive respiratory variation in the mitral valve spectral Doppler velocities and excessive respiratory variation in the tricuspid valve spectral Doppler velocities. 10. The mitral valve is normal in structure. No evidence of mitral valve regurgitation. No evidence of mitral stenosis. 11. The tricuspid valve is normal in structure. 12. The tricuspid valve is normal in structure. Tricuspid valve regurgitation is not demonstrated. 13. The aortic valve is normal in structure. Aortic valve regurgitation is not visualized. No evidence of aortic valve sclerosis or stenosis. 14. The pulmonic valve was normal in structure. Pulmonic valve regurgitation is trivial. 15. Moderately elevated pulmonary artery systolic pressure. 16. The inferior vena cava is dilated in size with <50% respiratory variability, suggesting right atrial pressure of 15 mmHg. FINDINGS  Left Ventricle: Left ventricular ejection fraction, by visual estimation, is 60 to 65%. The left ventricle has normal function. The left ventricle has no regional wall motion abnormalities. The left ventricular internal cavity size was the left ventricle is normal in size. There is no left ventricular hypertrophy. Left ventricular diastolic parameters were normal. Normal left atrial pressure. Right Ventricle: The right ventricular size is normal. No increase in right ventricular wall thickness. Global RV systolic function is has normal systolic function. The tricuspid regurgitant velocity is 2.74 m/s, and with an assumed right atrial pressure  of 15 mmHg, the estimated right ventricular systolic pressure is moderately elevated at  45.0 mmHg. Left Atrium: Left atrial size was normal in size. Right Atrium: Right atrial size was normal in size Pericardium: A large pericardial effusion is present. The pericardial effusion is circumferential. There is inversion of the right ventricular wall, inversion of the right atrial wall, excessive respiratory variation in the mitral valve spectral Doppler velocities and excessive respiratory variation in the tricuspid valve spectral Doppler velocities. There is evidence of cardiac tamponade. Mitral Valve: The mitral valve is normal in structure. No evidence of mitral valve regurgitation. No evidence of mitral valve stenosis by observation. Tricuspid Valve: The tricuspid valve is normal in structure. Tricuspid valve regurgitation is not demonstrated. Aortic Valve: The aortic valve is normal  in structure. Aortic valve regurgitation is not visualized. The aortic valve is structurally normal, with no evidence of sclerosis or stenosis. Pulmonic Valve: The pulmonic valve was normal in structure. Pulmonic valve regurgitation is trivial. Pulmonic regurgitation is trivial. Aorta: The aortic root, ascending aorta and aortic arch are all structurally normal, with no evidence of dilitation or obstruction. Venous: The inferior vena cava is dilated in size with less than 50% respiratory variability, suggesting right atrial pressure of 15 mmHg. IAS/Shunts: No atrial level shunt detected by color flow Doppler. There is no evidence of a patent foramen ovale. No ventricular septal defect is seen or detected. There is no evidence of an atrial septal defect.  LEFT VENTRICLE PLAX 2D LVIDd:         4.70 cm  Diastology LVIDs:         3.40 cm  LV e' lateral: 12.60 cm/s LV PW:         1.10 cm  LV e' medial:  9.36 cm/s LV IVS:        1.10 cm LVOT diam:     2.10 cm LV SV:         55 ml LV SV Index:   27.04 LVOT Area:     3.46 cm  RIGHT VENTRICLE RV S prime:     24.60 cm/s TAPSE (M-mode): 2.5 cm LEFT ATRIUM           Index LA diam:       3.60 cm 1.81 cm/m LA Vol (A2C): 71.4 ml 35.81 ml/m  AORTIC VALVE LVOT Vmax:   115.00 cm/s LVOT Vmean:  83.300 cm/s LVOT VTI:    0.196 m  AORTA Ao Root diam: 3.40 cm TRICUSPID VALVE TR Peak grad:   30.0 mmHg TR Vmax:        274.00 cm/s  SHUNTS Systemic VTI:  0.20 m Systemic Diam: 2.10 cm  Sanda Klein MD Electronically signed by Sanda Klein MD Signature Date/Time: 09/02/2019/9:49:44 PM    Final    Telemetry    09/03/19 NSR HRs in the 80-90's- Personally Reviewed  ECG    09/02/19 NSR with diffuse ST segment elevations in leads I, II, II, AvF, V4-V6- Personally Reviewed  Cardiac Studies   Echocardiogram 09/02/19:   1. Left ventricular ejection fraction, by visual estimation, is 60 to 65%. The left ventricle has normal function. There is no left ventricular hypertrophy.  2. The left ventricle has no regional wall motion abnormalities.  3. Global right ventricle has normal systolic function.The right ventricular size is normal. No increase in right ventricular wall thickness.  4. Left atrial size was normal.  5. Right atrial size was normal.  6. Findings are consistent with cardiac tamponade.  7. Large pericardial effusion.  8. The pericardial effusion is circumferential.  9. There is inversion of the right ventricular wall, inversion of the right atrial wall, excessive respiratory variation in the mitral valve spectral Doppler velocities and excessive respiratory variation in the tricuspid valve spectral Doppler  velocities. 10. The mitral valve is normal in structure. No evidence of mitral valve regurgitation. No evidence of mitral stenosis. 11. The tricuspid valve is normal in structure. 12. The tricuspid valve is normal in structure. Tricuspid valve regurgitation is not demonstrated. 13. The aortic valve is normal in structure. Aortic valve regurgitation is not visualized. No evidence of aortic valve sclerosis or stenosis. 14. The pulmonic valve was normal in structure. Pulmonic valve  regurgitation is trivial. 15. Moderately elevated pulmonary artery systolic pressure. 16. The  inferior vena cava is dilated in size with <50% respiratory variability, suggesting right atrial pressure of 15 mmHg.  Patient Profile     54 y.o. male with no significant PMH who was admitted 09/02/19 with pericarditis.   Assessment & Plan    1. Acute pericarditis, likely viral etiology: -Presented 1/18 with 4-5 day hx of fever, body aches >>negative for COVID-19 with symptoms consistent with acute pericarditis.  -EKG with diffuse ST elevation -Echocardiogram performed 09/02/19 with normal LVEF 60-65% with NRWMA, circumferential large pericardial effusion with evidence of tamponade>>>repeat echo this AM with pending results -Vitals stable, HR 90's>>BP 122/83>135/77>128/86>140/87 -Treated with high dose NSAIDs/colchine  -Likely will need pericardiocentesis today>>>will discuss with rounding MD   Signed, Kathyrn Drown NP-C [place] Pager: [phone] 09/03/2019, 7:41 AM     For questions or updates, please contact   Please consult [email or web address] for contact info under Cardiology/STEMI.  I have seen and examined the patient along with Kathyrn Drown NP-C.  I have reviewed the chart, notes and new data.  I agree with PA/NP's note.  Key new complaints: Feels uncomfortable lying flat but this is mostly due to pain, rather than dyspnea. Key examination changes: Jugular veins are elevated at about 10 cm.  Mildly reduced pulse pressure, otherwise normal cardiovascular exam.  Overtly tachycardic, but reports that his usual resting heart rate is in the 50s, currently in the 90s. Key new findings / data: Reviewed the repeat echocardiogram.  Even though only about 12 hours have passed since the previous study the pericardial effusion appears to be slightly larger and the signs of tamponade are very well defined: Plethoric inferior vena cava without respiratory collapse, marked increase in respiratory  variation on mitral and tricuspid inflow, early diastolic right ventricular and right atrial collapse.  Inflammatory markers are severely elevated (ESR 69, CRP 22.5).  Autoimmune screening tests are not yet back.  PLAN: Although Craig Benjamin is not yet in clinical tamponade and looks remarkably well, the echocardiographic findings are quite compelling.  He is very fit and well trained and is clearly able to tolerate hemodynamic embarrassment better than the average patient, but I am afraid that he is heading towards clinical decompensation. I recommended pericardiocentesis. Discussed the pros and cons of this procedure and the possible complication with him and his wife Jeani Hawking on the phone.  Reviewed the possibility of pneumothorax, arrhythmia, bleeding, infection and the small risk of death.  Possible that he would need additional procedures (for example chest tube if he has a pneumothorax or pericardial window if the rate of fluid drainage remains high).  Would like to leave the pericardial drain in at least overnight if he can tolerate the discomfort.  He has several salient questions which were answered in detail.  Agrees to go ahead with the procedure today.  This procedure has been fully reviewed with the patient and written informed consent has been obtained.  Sanda Klein, MD, [place] [phone] 09/03/2019, 10:44 AM

## 2019-09-03 NOTE — Interval H&P Note (Signed)
History and Physical Interval Note:  09/03/2019 3:54 PM  Craig Benjamin  has presented today for surgery, with the diagnosis of pericardial effusion, cardiac tamponade. The various methods of treatment have been discussed with the patient and family. After consideration of risks, benefits and other options for treatment, the patient has consented to  Procedure(s): PERICARDIOCENTESIS (N/A) as a surgical intervention.  The patient's history has been reviewed, patient examined, no change in status, stable for surgery.  I have reviewed the patient's chart and labs.  Questions were answered to the patient's satisfaction.     Verne Carrow

## 2019-09-04 ENCOUNTER — Other Ambulatory Visit: Payer: Self-pay | Admitting: Physician Assistant

## 2019-09-04 DIAGNOSIS — I301 Infective pericarditis: Principal | ICD-10-CM

## 2019-09-04 DIAGNOSIS — I3139 Other pericardial effusion (noninflammatory): Secondary | ICD-10-CM

## 2019-09-04 DIAGNOSIS — I313 Pericardial effusion (noninflammatory): Secondary | ICD-10-CM

## 2019-09-04 LAB — RHEUMATOID FACTOR: Rheumatoid fact SerPl-aCnc: 17.6 IU/mL — ABNORMAL HIGH (ref 0.0–13.9)

## 2019-09-04 LAB — GLUCOSE, BODY FLUID OTHER: Glucose, Body Fluid Other: 53 mg/dL

## 2019-09-04 LAB — PROTEIN, BODY FLUID (OTHER): Total Protein, Body Fluid Other: 5.1 g/dL

## 2019-09-04 LAB — PH, BODY FLUID: pH, Body Fluid: 7.6

## 2019-09-04 LAB — PATHOLOGIST SMEAR REVIEW

## 2019-09-04 LAB — LD, BODY FLUID (OTHER): LD, Body Fluid: 2191 IU/L

## 2019-09-04 LAB — ANA W/REFLEX IF POSITIVE: Anti Nuclear Antibody (ANA): NEGATIVE

## 2019-09-04 MED ORDER — COLCHICINE 0.6 MG PO TABS: 0.6000 mg | ORAL_TABLET | Freq: Every day | ORAL | 0 refills | Status: AC

## 2019-09-04 MED ORDER — PANTOPRAZOLE SODIUM 40 MG PO TBEC: 40.0000 mg | DELAYED_RELEASE_TABLET | Freq: Every day | ORAL | 3 refills | Status: AC

## 2019-09-04 MED ORDER — IBUPROFEN 800 MG PO TABS: 800.0000 mg | ORAL_TABLET | Freq: Three times a day (TID) | ORAL | 0 refills | Status: AC

## 2019-09-04 MED FILL — Lidocaine HCl Local Preservative Free (PF) Inj 1%: INTRAMUSCULAR | Qty: 30 | Status: AC

## 2019-09-04 NOTE — Plan of Care (Signed)
Pt is alert and oriented, resting in bed on room air, breathing is even and unlabored. Pt complained of feeling mild chest pain, very fatigued and cold at the beginning of the night, blankets were given and PRN medication was given (see MAR). Lungs are clear. Pt has been resting well throughout the night, stands up to use the urinal or go to the bathroom with standby assist. Pt now denies chest pain, shortness of breath, and dizziness. Besides wanting to go home, pt does not voice any concerns or complaints. Pt went from ST in the 100s to NSR in the 60s this morning. Call bell is within reach and bed is in lowest position. Problem: Education: Goal: Knowledge of General Education information will improve Description: Including pain rating scale, medication(s)/side effects and non-pharmacologic comfort measures Outcome: Progressing   Problem: Clinical Measurements: Goal: Ability to maintain clinical measurements within normal limits will improve Outcome: Progressing Goal: Respiratory complications will improve Outcome: Progressing Goal: Cardiovascular complication will be avoided Outcome: Progressing   Problem: Activity: Goal: Risk for activity intolerance will decrease Outcome: Progressing

## 2019-09-04 NOTE — Progress Notes (Signed)
Chaplain engaged in initial visit with Mr. Craig Benjamin who shared he would be leaving soon.  Mr. Craig Benjamin expressed happiness over being able to go home and be in his own bed.  Chaplain offered support and affirmed Mr. Craig Benjamin's joy over leaving soon.

## 2019-09-04 NOTE — Discharge Instructions (Signed)
[employer]'s Current Therapy 2017 (pp. 79-158). [place], [place]: [employer]> Stoelting's Anesthesia and Co-Existing Disease (7th ed., pp. 225-236). [place]: [employer]> Braunwald's Heart Disease (11th ed., pp. 174-243). [place], [place]: [employer].">  Pericardial Effusion  Pericardial effusion is a buildup of fluid around the heart. The heart is surrounded by a thin, double-layered sac (pericardium). When fluid builds up in this sac, it can put too much pressure on the heart and make it harder for the heart to pump blood (cardiac tamponade). This can be life-threatening. What are the causes? Often, the cause of pericardial effusion is not known (idiopathic effusion). In some cases, the condition may be caused by:  Infections from a virus, fungus, parasite, or bacteria.  Damage to the pericardium from heart surgery or a heart attack.  Inflammatory diseases, such as rheumatoid arthritis or lupus.  Kidney or thyroid disease.  Cancer or treatment for cancer, including radiation or chemotherapy.  Certain medicines, including medicines for tuberculosis or seizures.  Chest injury. What are the signs or symptoms? Pericardial effusion may not cause symptoms at first, especially if the fluid builds up slowly. In time, pressure on the heart may cause:  Chest pain and trouble breathing. This may include pain and shortness of breath that get worse when lying down.  Dizziness and fainting.  Coughing and hiccups.  Skipped heartbeats.  Anxiety and confusion.  A bluish skin color (cyanosis).  Swollen legs and ankles.  A feeling of fullness in the chest. How is this diagnosed? This condition is diagnosed based on your symptoms and testing, which may include:  A test that creates ultrasound images of your heart (echocardiogram).  A test to examine the electrical functions of your heart (electrocardiogram).  Chest X-ray.  CT scan.  MRI.  Blood tests. How is this  treated? Treatment for this condition depends on the cause of your condition and how severe your symptoms are. Treatment may include:  Medicines, such as: ? NSAIDs, such as ibuprofen. ? Anti-inflammatory medicines, such as steroids. ? Medicines to fight infection, such as antibiotics.  Hospital treatment. This may be necessary if the fluid around the heart prevents it from pumping enough blood. Treatment in the hospital may include: ? IV fluids. ? Breathing support.  Surgery. This may be needed in severe cases. Surgery may include: ? A procedure to remove fluid from the pericardium by placing a needle into it (pericardiocentesis). ? A procedure to make a permanent opening in the pericardium (pericardial window). ? Open heart surgery. Follow these instructions at home:  Take over-the-counter and prescription medicines only as told by your health care provider.  If you were prescribed a medicine to fight infection, such as antibiotic medicine, take it as told by your health care provider. Do not stop taking the medicine even if you start to feel better.  Rest as told by your health care provider. Ask your health care provider what activities are safe for you.  Keep all follow-up visits as told by your health care provider. This is important. Contact a health care provider if:  You have a cough or hiccups that do not go away.  You have severe swelling in your legs or ankles. Get help right away if you:  Have fast or irregular heartbeats (palpitations).  Feel dizzy or light-headed.  Faint.  Have chest pain.  Have trouble breathing. These symptoms may represent a serious problem that is an emergency. Do not wait to see if the symptoms will go away. Get medical help right   away. Call your local emergency services (911 in the [place] Do not drive yourself to the hospital. Summary  Pericardial effusion is a buildup of fluid around the heart. The fluid can eventually prevent the  heart from pumping enough blood (cardiac tamponade), which can be life-threatening.  Pericardial effusion may not cause symptoms at first.  Treatment for pericardial effusion depends on the cause of your condition and how severe your symptoms are. In severe cases, hospital treatment or surgery may be required.  Rest as told by your health care provider. Ask your health care provider what activities are safe for you. This information is not intended to replace advice given to you by your health care provider. Make sure you discuss any questions you have with your health care provider. Document Revised: 12/18/2018 Document Reviewed: 12/18/2018 Elsevier Patient Education  2020 [employer] Inc.  

## 2019-09-04 NOTE — Progress Notes (Signed)
Progress Note  Patient Name: Craig Benjamin Date of Encounter: 09/04/2019  Primary Cardiologist: No primary care provider on file. New ([facility]una[facility])  Subjective   Feels well except for pericardial drain related discomfort.  Inpatient Medications    Scheduled Meds:  Chlorhexidine Gluconate Cloth  6 each Topical Daily   colchicine  0.6 mg Oral BID   ibuprofen  800 mg Oral TID   pantoprazole  40 mg Oral Daily   sodium chloride flush  3 mL Intravenous Q12H   sodium chloride flush  3 mL Intravenous Q12H   Continuous Infusions:  sodium chloride     PRN Meds: sodium chloride, morphine injection, oxyCODONE-acetaminophen, sodium chloride flush   Vital Signs    Vitals:   09/04/19 0600 09/04/19 0700 09/04/19 0741 09/04/19 0800  BP: 97/63 112/81    Pulse: 70 79    Resp: 18 (!) 25  18  Temp:   98.5 F (36.9 C)   TempSrc:   Oral   SpO2: 95% 98%    Weight:      Height:        Intake/Output Summary (Last 24 hours) at 09/04/2019 0924 Last data filed at 09/04/2019 0700 Gross per 24 hour  Intake 900 ml  Output 325 ml  Net 575 ml   Last 3 Weights 09/04/2019 09/03/2019 09/02/2019  Weight (lbs) 184 lb 1.4 oz 182 lb 14.4 oz 185 lb 1.6 oz  Weight (kg) 83.5 kg 82.963 kg 83.961 kg      Telemetry    Sinus rhythm, note that heart rate has decreased to the 60s following pericardiocentesis- Personally Reviewed  ECG    No new tracing- Personally Reviewed  Physical Exam  Appears well, smiling sitting up in bed GEN: No acute distress.   Neck: No JVD Cardiac: RRR, no murmurs, rubs, or gallops.  Respiratory: Clear to auscultation bilaterally. GI: Soft, nontender, non-distended  MS: No edema; No deformity. Neuro:  Nonfocal  Psych: Normal affect   Labs    High Sensitivity Troponin:   Recent Labs  Lab 09/02/19 1545  TROPONINIHS 26*      Chemistry Recent Labs  Lab 09/02/19 1545  NA 133*  K 4.2  CL 101  CO2 23  GLUCOSE 94  BUN 12  CREATININE 0.97  CALCIUM 8.6*    PROT 7.1  ALBUMIN 3.3*  AST 27  ALT 37  ALKPHOS 82  BILITOT 0.7  GFRNONAA >60  GFRAA >60  ANIONGAP 9     Hematology Recent Labs  Lab 09/02/19 1545  WBC 5.8  RBC 4.26  HGB 11.1*  HCT 34.6*  MCV 81.2  MCH 26.1  MCHC 32.1  RDW 14.6  PLT 208    BNPNo results for input(s): BNP, PROBNP in the last 168 hours.   DDimer No results for input(s): DDIMER in the last 168 hours.   Radiology    CARDIAC CATHETERIZATION  Result Date: 09/03/2019 1. Pericardial tamponade 2. Successful pericardiocentesis with removal of 1020 cc of bloody fluid Fluid sent for analysis. Drain to remain in place overnight.   DG Chest Port 1 View  Result Date: 09/02/2019 CLINICAL DATA:  Pericardial effusion.  Fever and body aches. EXAM: PORTABLE CHEST 1 VIEW COMPARISON:  None. FINDINGS: The cardiac silhouette is enlarged. Opacity in left retrocardiac region obscures the left hemidiaphragm. The hila and mediastinum are normal. No pneumothorax. No nodules or masses. No other acute abnormalities. IMPRESSION: The cardiac silhouette is enlarged which could represent cardiomegaly or a pericardial effusion given history. Opacity in  left retrocardiac region may simply represent atelectasis. A PA and lateral chest x-ray could better evaluate. Electronically Signed   By: Dorise Bullion III M.D   On: 09/02/2019 16:19   ECHOCARDIOGRAM COMPLETE  Result Date: 09/02/2019   ECHOCARDIOGRAM REPORT   Patient Name:   Craig Benjamin Date of Exam: 09/02/2019 Medical Rec #:  [id number]      Height:       69.0 in Accession #:    [id number]     Weight:       184.0 lb Date of Birth:  [birth year]      BSA:          1.99 m Patient Age:    54 years       BP:           131/98 mmHg Patient Gender: M              HR:           86 bpm. Exam Location:  Inpatient Procedure: 2D Echo STAT ECHO Indications:    pericardial effusion 423.9  History:        Patient has no prior history of Echocardiogram examinations.  Sonographer:    Johny Chess  Referring Phys: [place]  1. Left ventricular ejection fraction, by visual estimation, is 60 to 65%. The left ventricle has normal function. There is no left ventricular hypertrophy.  2. The left ventricle has no regional wall motion abnormalities.  3. Global right ventricle has normal systolic function.The right ventricular size is normal. No increase in right ventricular wall thickness.  4. Left atrial size was normal.  5. Right atrial size was normal.  6. Findings are consistent with cardiac tamponade.  7. Large pericardial effusion.  8. The pericardial effusion is circumferential.  9. There is inversion of the right ventricular wall, inversion of the right atrial wall, excessive respiratory variation in the mitral valve spectral Doppler velocities and excessive respiratory variation in the tricuspid valve spectral Doppler velocities. 10. The mitral valve is normal in structure. No evidence of mitral valve regurgitation. No evidence of mitral stenosis. 11. The tricuspid valve is normal in structure. 12. The tricuspid valve is normal in structure. Tricuspid valve regurgitation is not demonstrated. 13. The aortic valve is normal in structure. Aortic valve regurgitation is not visualized. No evidence of aortic valve sclerosis or stenosis. 14. The pulmonic valve was normal in structure. Pulmonic valve regurgitation is trivial. 15. Moderately elevated pulmonary artery systolic pressure. 16. The inferior vena cava is dilated in size with <50% respiratory variability, suggesting right atrial pressure of 15 mmHg. FINDINGS  Left Ventricle: Left ventricular ejection fraction, by visual estimation, is 60 to 65%. The left ventricle has normal function. The left ventricle has no regional wall motion abnormalities. The left ventricular internal cavity size was the left ventricle is normal in size. There is no left ventricular hypertrophy. Left ventricular diastolic parameters were normal. Normal left  atrial pressure. Right Ventricle: The right ventricular size is normal. No increase in right ventricular wall thickness. Global RV systolic function is has normal systolic function. The tricuspid regurgitant velocity is 2.74 m/s, and with an assumed right atrial pressure  of 15 mmHg, the estimated right ventricular systolic pressure is moderately elevated at 45.0 mmHg. Left Atrium: Left atrial size was normal in size. Right Atrium: Right atrial size was normal in size Pericardium: A large pericardial effusion is present. The pericardial effusion is circumferential. There is inversion of the right ventricular  wall, inversion of the right atrial wall, excessive respiratory variation in the mitral valve spectral Doppler velocities and excessive respiratory variation in the tricuspid valve spectral Doppler velocities. There is evidence of cardiac tamponade. Mitral Valve: The mitral valve is normal in structure. No evidence of mitral valve regurgitation. No evidence of mitral valve stenosis by observation. Tricuspid Valve: The tricuspid valve is normal in structure. Tricuspid valve regurgitation is not demonstrated. Aortic Valve: The aortic valve is normal in structure. Aortic valve regurgitation is not visualized. The aortic valve is structurally normal, with no evidence of sclerosis or stenosis. Pulmonic Valve: The pulmonic valve was normal in structure. Pulmonic valve regurgitation is trivial. Pulmonic regurgitation is trivial. Aorta: The aortic root, ascending aorta and aortic arch are all structurally normal, with no evidence of dilitation or obstruction. Venous: The inferior vena cava is dilated in size with less than 50% respiratory variability, suggesting right atrial pressure of 15 mmHg. IAS/Shunts: No atrial level shunt detected by color flow Doppler. There is no evidence of a patent foramen ovale. No ventricular septal defect is seen or detected. There is no evidence of an atrial septal defect.  LEFT VENTRICLE  PLAX 2D LVIDd:         4.70 cm  Diastology LVIDs:         3.40 cm  LV e' lateral: 12.60 cm/s LV PW:         1.10 cm  LV e' medial:  9.36 cm/s LV IVS:        1.10 cm LVOT diam:     2.10 cm LV SV:         55 ml LV SV Index:   27.04 LVOT Area:     3.46 cm  RIGHT VENTRICLE RV S prime:     24.60 cm/s TAPSE (M-mode): 2.5 cm LEFT ATRIUM           Index LA diam:      3.60 cm 1.81 cm/m LA Vol (A2C): 71.4 ml 35.81 ml/m  AORTIC VALVE LVOT Vmax:   115.00 cm/s LVOT Vmean:  83.300 cm/s LVOT VTI:    0.196 m  AORTA Ao Root diam: 3.40 cm TRICUSPID VALVE TR Peak grad:   30.0 mmHg TR Vmax:        274.00 cm/s  SHUNTS Systemic VTI:  0.20 m Systemic Diam: 2.10 cm  Sanda Klein MD Electronically signed by Sanda Klein MD Signature Date/Time: 09/02/2019/9:49:44 PM    Final    ECHOCARDIOGRAM LIMITED  Result Date: 09/03/2019   ECHOCARDIOGRAM LIMITED REPORT   Patient Name:   Craig Benjamin Date of Exam: 09/03/2019 Medical Rec #:  [id number]      Height:       69.0 in Accession #:    [id number]     Weight:       182.9 lb Date of Birth:  Mar 31, 1966      BSA:          1.99 m Patient Age:    76 years       BP:           122/83 mmHg Patient Gender: M              HR:           86 bpm. Exam Location:  Inpatient  Procedure: Limited Echo, Color Doppler and Cardiac Doppler Indications:    I31.3 Pericardial effusion  History:        Patient has prior history of Echocardiogram examinations, most  recent 09/02/2019.  Sonographer:    Raquel Sarna Senior RDCS Referring Phys: [id number] [place]  1. Left ventricular ejection fraction, by visual estimation, is 60 to 65%. There is no increased left ventricular wall thickness.  2. The left ventricle has no regional wall motion abnormalities.  3. Global right ventricle has normal systolc function.The right ventricular size is normal. no increase in right ventricular wall thickness.  4. Findings are consistent with cardiac tamponade.  5. Large pericardial effusion.  6. There is  excessive respiratory variation in the tricuspid valve spectral Doppler velocities, excessive respiratory variation in the mitral valve spectral Doppler velocities, inversion of the right atrial wall and inversion of the right ventricular wall.  7. Mildly elevated pulmonary artery systolic pressure.  8. The inferior vena cava is dilated in size with <50% respiratory variability, suggesting right atrial pressure of 15 mmHg.  9. Compared to the study performed just 12 hours earlier, the pericardial effusion appears slightly larger and the signs of tamponade are even better defined. 10. Prior images reviewed side by side. FINDINGS  Left Ventricle: Left ventricular ejection fraction, by visual estimation, is 60 to 65%. The left ventricle has no regional wall motion abnormalities. The left ventricular internal cavity size was the left ventricle is normal in size. There is no increased left ventricular wall thickness. Right Ventricle: The right ventricular size is normal. No increase in right ventricular wall thickness. Global RV systolic function is has normal systolic function. The tricuspid regurgitant velocity is 2.16 m/s, and with an assumed right atrial pressure  of 15 mmHg, the estimated right ventricular systolic pressure is mildly elevated at 33.7 mmHg. Left Atrium: Left atrial size was normal in size. Right Atrium: Right atrial size was normal in size. Right atrial pressure is estimated at 15 mmHg. Pericardium: A large pericardial effusion is present is seen. A large pericardial effusion is present. There is excessive respiratory variation in the tricuspid valve spectral Doppler velocities, excessive respiratory variation in the mitral valve spectral Doppler velocities, inversion of the right atrial wall and inversion of the right ventricular wall. There is evidence of cardiac tamponade. Venous: The inferior vena cava is dilated in size with less than 50% respiratory variability, suggesting right atrial pressure  of 15 mmHg.  TRICUSPID VALVE             Normals TR Peak grad:   18.7 mmHg TR Vmax:        216.00 cm/s 288 cm/s  Joshawa Dubin MD Electronically signed by Sanda Klein MD Signature Date/Time: 09/03/2019/10:42:51 AM    Final     Cardiac Studies   Bedside echocardiogram performed this morning with portable device shows no evidence of any residual pericardial fluid.  There are no regional wall motion abnormalities.  The pericardial drain was removed and a dressing was placed.  Patient Profile     54 y.o. male with acute pericarditis and an extremely large pericardial effusions and echo findings of tamponade, status post pericardiocentesis.  Fluid was bloody with markedly elevated inflammatory markers.  Assessment & Plan    Pericardial drain removed. At this point working diagnosis remains viral pericarditis.  No evidence of myocardial involvement. Note mild elevation in RA factor at 17.6.  ANA pending. We will repeat ESR and CRP to monitor disease progression. We will follow up on cytology and serological markers for autoimmune disease. He appears well and will be ready for discharge later today. Follow-up echocardiogram (limited for effusion) in 2 weeks and office visit a  week or 2 after that.  For questions or updates, please contact [place] Please consult [email or web address] for contact info under        Signed, Sanda Klein, MD  09/04/2019, 9:24 AM

## 2019-09-04 NOTE — Discharge Summary (Signed)
Discharge Summary    Patient ID: Craig Benjamin MRN: [id number]; DOB: [birth year]  Admit date: 09/02/2019 Discharge date: 09/04/2019  Primary Care Provider: Gillian Scarce, MD  Primary Cardiologist: Craig Fair, MD  Primary Electrophysiologist:  None   Discharge Diagnoses    Principal Problem:   Pericardial effusion Active Problems:   Pericardial tamponade    Diagnostic Studies/Procedures    Echo 09/03/19: 1. Left ventricular ejection fraction, by visual estimation, is 60 to 65%. There is no increased left ventricular wall thickness.  2. The left ventricle has no regional wall motion abnormalities.  3. Global right ventricle has normal systolc function.The right ventricular size is normal. no increase in right ventricular wall thickness.  4. Findings are consistent with cardiac tamponade.  5. Large pericardial effusion.  6. There is excessive respiratory variation in the tricuspid valve spectral Doppler velocities, excessive respiratory variation in the mitral valve spectral Doppler velocities, inversion of the right atrial wall and inversion of the right ventricular  wall.  7. Mildly elevated pulmonary artery systolic pressure.  8. The inferior vena cava is dilated in size with <50% respiratory variability, suggesting right atrial pressure of 15 mmHg.  9. Compared to the study performed just 12 hours earlier, the pericardial effusion appears slightly larger and the signs of tamponade are even better defined. 10. Prior images reviewed side by side. _____________   Echo 09/02/19: 1. Left ventricular ejection fraction, by visual estimation, is 60 to 65%. The left ventricle has normal function. There is no left ventricular hypertrophy.  2. The left ventricle has no regional wall motion abnormalities.  3. Global right ventricle has normal systolic function.The right ventricular size is normal. No increase in right ventricular wall thickness.  4. Left atrial size was normal.  5.  Right atrial size was normal.  6. Findings are consistent with cardiac tamponade.  7. Large pericardial effusion.  8. The pericardial effusion is circumferential.  9. There is inversion of the right ventricular wall, inversion of the right atrial wall, excessive respiratory variation in the mitral valve spectral Doppler velocities and excessive respiratory variation in the tricuspid valve spectral Doppler  velocities. 10. The mitral valve is normal in structure. No evidence of mitral valve regurgitation. No evidence of mitral stenosis. 11. The tricuspid valve is normal in structure. 12. The tricuspid valve is normal in structure. Tricuspid valve regurgitation is not demonstrated. 13. The aortic valve is normal in structure. Aortic valve regurgitation is not visualized. No evidence of aortic valve sclerosis or stenosis. 14. The pulmonic valve was normal in structure. Pulmonic valve regurgitation is trivial. 15. Moderately elevated pulmonary artery systolic pressure. 16. The inferior vena cava is dilated in size with <50% respiratory variability, suggesting right atrial pressure of 15 mmHg.  History of Present Illness     Craig Benjamin is a 54 y.o. male who is generally in good health and very active, was seen in [facility] for evaluation of SOB and pericarditis.   Craig Benjamin began experiencing symptoms suggestive of a viral illness (low-grade elevation in temperature, body aches) approximately 4-5 days ago. He did not have a history of COVID-19 exposure and his Covid test was negative. Later he began developing an aching discomfort in his lower posterior left thorax worsened by lying flat in bed or by taking a very deep breath. It was vaguely reminiscent of previous problems with nephrolithiasis so he went to have an abdominal CT. This did not show any evidence of nephrolithiasis but reportedly showed presence of a pericardial  effusion (the study was performed at [facility] and cannot be reviewed). An ECG  was performed at [facility] and shows 2 mm ST segment elevation in virtually all ECG leads, strongly suggestive of acute pericarditis.  He has not had cough, shaking chills, hemoptysis, leg swelling, syncope or presyncope, palpitations, dizziness or lightheadedness, history of chest trauma or gastrointestinal symptoms. He does not have any other symptoms to suggest autoimmune or collagen vascular disease.  He has noted recent shortness of breath and thinks it might be a little difficult to climb a flight of stairs, but otherwise he is in good physical shape.   He lives a very healthy lifestyle with a healthy diet and exercises 6 days a week (3 days cardio, 3 days weights).   History reviewed. No pertinent past medical history. Kidney stone 2018. He has a history of chickenpox, mildly elevated PSA, HSV-2 infection.   Hospital Course     Consultants: none  Viral pericarditis - suspected Pericardial effusion with tamponade physiology S/P pericardiocentesis Patient was admitted to cardiology with suspected viral pericarditis based on prodrome. Echocardiogram revealed very large circumferential pericardial effusion with multiple echocardiographic signs of tamponade: right ventricular early diastolic collapse, marked increase in respiratory variation in the mitral and tricuspid inflow, dilated inferior vena cava. He remained hemodynamically compensated with only mild resting tachycardia. Decision was made to undergo pericardiocentesis later that day following repeat echo. Procedure resulted in removal of 1020 cc of bloody fluid.  Fluid analysis pending. CRP was 22.5, sed rate 69, RA   17.6, ANA pending. Pericardial drain was left in place overnight and removed 09/04/19 without complications.   No evidence of myocardial involvement.    I have arranged for a limited echo on 09/18/19 with follow up on 09/27/19. We will also repeat sed rate, CRP, and RF at echo appt.  Pt seen and examined by Dr.  Royann Benjamin and deemed stable for discharge.      Did the patient have an acute coronary syndrome (MI, NSTEMI, STEMI, etc) this admission?:  No                               Did the patient have a percutaneous coronary intervention (stent / angioplasty)?:  No.   _____________  Discharge Vitals Blood pressure 112/81, pulse 79, temperature 99.1 F (37.3 C), temperature source Oral, resp. rate 18, height 5\' 9"  (1.753 m), weight 83.5 kg, SpO2 98 %.  Filed Weights   09/02/19 2054 09/03/19 0416 09/04/19 0400  Weight: 84 kg 83 kg 83.5 kg    Labs & Radiologic Studies    CBC Recent Labs    09/02/19 1545  WBC 5.8  NEUTROABS 3.4  HGB 11.1*  HCT 34.6*  MCV 81.2  PLT 208   Basic Metabolic Panel Recent Labs    09/04/19 1545  NA 133*  K 4.2  CL 101  CO2 23  GLUCOSE 94  BUN 12  CREATININE 0.97  CALCIUM 8.6*   Liver Function Tests Recent Labs    09/02/19 1545  AST 27  ALT 37  ALKPHOS 82  BILITOT 0.7  PROT 7.1  ALBUMIN 3.3*   Recent Labs    09/02/19 1545  LIPASE 15   High Sensitivity Troponin:   Recent Labs  Lab 09/02/19 1545  TROPONINIHS 26*    BNP Invalid input(s): POCBNP D-Dimer No results for input(s): DDIMER in the last 72 hours. Hemoglobin A1C No results for input(s):  HGBA1C in the last 72 hours. Fasting Lipid Panel No results for input(s): CHOL, HDL, LDLCALC, TRIG, CHOLHDL, LDLDIRECT in the last 72 hours. Thyroid Function Tests No results for input(s): TSH, T4TOTAL, T3FREE, THYROIDAB in the last 72 hours.  Invalid input(s): FREET3 _____________  CARDIAC CATHETERIZATION  Result Date: 09/03/2019 1. Pericardial tamponade 2. Successful pericardiocentesis with removal of 1020 cc of bloody fluid Fluid sent for analysis. Drain to remain in place overnight.   DG Chest Port 1 View  Result Date: 09/02/2019 CLINICAL DATA:  Pericardial effusion.  Fever and body aches. EXAM: PORTABLE CHEST 1 VIEW COMPARISON:  None. FINDINGS: The cardiac silhouette is enlarged.  Opacity in left retrocardiac region obscures the left hemidiaphragm. The hila and mediastinum are normal. No pneumothorax. No nodules or masses. No other acute abnormalities. IMPRESSION: The cardiac silhouette is enlarged which could represent cardiomegaly or a pericardial effusion given history. Opacity in left retrocardiac region may simply represent atelectasis. A PA and lateral chest x-ray could better evaluate. Electronically Signed   By: Gerome Sam III M.D   On: 09/02/2019 16:19   ECHOCARDIOGRAM COMPLETE  Result Date: 09/02/2019   ECHOCARDIOGRAM REPORT   Patient Name:   Craig Benjamin Date of Exam: 09/02/2019 Medical Rec #:  [id number]      Height:       69.0 in Accession #:    [id number]     Weight:       184.0 lb Date of Birth:  Jan 09, 1966      BSA:          1.99 m Patient Age:    53 years       BP:           131/98 mmHg Patient Gender: M              HR:           86 bpm. Exam Location:  Inpatient Procedure: 2D Echo STAT ECHO Indications:    pericardial effusion 423.9  History:        Patient has no prior history of Echocardiogram examinations.  Sonographer:    Delcie Roch Referring Phys: [phone] MIHAI CROITORU IMPRESSIONS  1. Left ventricular ejection fraction, by visual estimation, is 60 to 65%. The left ventricle has normal function. There is no left ventricular hypertrophy.  2. The left ventricle has no regional wall motion abnormalities.  3. Global right ventricle has normal systolic function.The right ventricular size is normal. No increase in right ventricular wall thickness.  4. Left atrial size was normal.  5. Right atrial size was normal.  6. Findings are consistent with cardiac tamponade.  7. Large pericardial effusion.  8. The pericardial effusion is circumferential.  9. There is inversion of the right ventricular wall, inversion of the right atrial wall, excessive respiratory variation in the mitral valve spectral Doppler velocities and excessive respiratory variation in the tricuspid  valve spectral Doppler velocities. 10. The mitral valve is normal in structure. No evidence of mitral valve regurgitation. No evidence of mitral stenosis. 11. The tricuspid valve is normal in structure. 12. The tricuspid valve is normal in structure. Tricuspid valve regurgitation is not demonstrated. 13. The aortic valve is normal in structure. Aortic valve regurgitation is not visualized. No evidence of aortic valve sclerosis or stenosis. 14. The pulmonic valve was normal in structure. Pulmonic valve regurgitation is trivial. 15. Moderately elevated pulmonary artery systolic pressure. 16. The inferior vena cava is dilated in size with <50% respiratory variability, suggesting right atrial pressure  of 15 mmHg. FINDINGS  Left Ventricle: Left ventricular ejection fraction, by visual estimation, is 60 to 65%. The left ventricle has normal function. The left ventricle has no regional wall motion abnormalities. The left ventricular internal cavity size was the left ventricle is normal in size. There is no left ventricular hypertrophy. Left ventricular diastolic parameters were normal. Normal left atrial pressure. Right Ventricle: The right ventricular size is normal. No increase in right ventricular wall thickness. Global RV systolic function is has normal systolic function. The tricuspid regurgitant velocity is 2.74 m/s, and with an assumed right atrial pressure  of 15 mmHg, the estimated right ventricular systolic pressure is moderately elevated at 45.0 mmHg. Left Atrium: Left atrial size was normal in size. Right Atrium: Right atrial size was normal in size Pericardium: A large pericardial effusion is present. The pericardial effusion is circumferential. There is inversion of the right ventricular wall, inversion of the right atrial wall, excessive respiratory variation in the mitral valve spectral Doppler velocities and excessive respiratory variation in the tricuspid valve spectral Doppler velocities. There is  evidence of cardiac tamponade. Mitral Valve: The mitral valve is normal in structure. No evidence of mitral valve regurgitation. No evidence of mitral valve stenosis by observation. Tricuspid Valve: The tricuspid valve is normal in structure. Tricuspid valve regurgitation is not demonstrated. Aortic Valve: The aortic valve is normal in structure. Aortic valve regurgitation is not visualized. The aortic valve is structurally normal, with no evidence of sclerosis or stenosis. Pulmonic Valve: The pulmonic valve was normal in structure. Pulmonic valve regurgitation is trivial. Pulmonic regurgitation is trivial. Aorta: The aortic root, ascending aorta and aortic arch are all structurally normal, with no evidence of dilitation or obstruction. Venous: The inferior vena cava is dilated in size with less than 50% respiratory variability, suggesting right atrial pressure of 15 mmHg. IAS/Shunts: No atrial level shunt detected by color flow Doppler. There is no evidence of a patent foramen ovale. No ventricular septal defect is seen or detected. There is no evidence of an atrial septal defect.  LEFT VENTRICLE PLAX 2D LVIDd:         4.70 cm  Diastology LVIDs:         3.40 cm  LV e' lateral: 12.60 cm/s LV PW:         1.10 cm  LV e' medial:  9.36 cm/s LV IVS:        1.10 cm LVOT diam:     2.10 cm LV SV:         55 ml LV SV Index:   27.04 LVOT Area:     3.46 cm  RIGHT VENTRICLE RV S prime:     24.60 cm/s TAPSE (M-mode): 2.5 cm LEFT ATRIUM           Index LA diam:      3.60 cm 1.81 cm/m LA Vol (A2C): 71.4 ml 35.81 ml/m  AORTIC VALVE LVOT Vmax:   115.00 cm/s LVOT Vmean:  83.300 cm/s LVOT VTI:    0.196 m  AORTA Ao Root diam: 3.40 cm TRICUSPID VALVE TR Peak grad:   30.0 mmHg TR Vmax:        274.00 cm/s  SHUNTS Systemic VTI:  0.20 m Systemic Diam: 2.10 cm  Sanda Klein MD Electronically signed by Sanda Klein MD Signature Date/Time: 09/02/2019/9:49:44 PM    Final    ECHOCARDIOGRAM LIMITED  Result Date: 09/03/2019    ECHOCARDIOGRAM LIMITED REPORT   Patient Name:   Craig Benjamin Date of Exam:  09/03/2019 Medical Rec #:  [id number]      Height:       69.0 in Accession #:    [id number]     Weight:       182.9 lb Date of Birth:  [birth year]      BSA:          1.99 m Patient Age:    53 years       BP:           122/83 mmHg Patient Gender: M              HR:           86 bpm. Exam Location:  Inpatient  Procedure: Limited Echo, Color Doppler and Cardiac Doppler Indications:    I31.3 Pericardial effusion  History:        Patient has prior history of Echocardiogram examinations, most                 recent 09/02/2019.  Sonographer:    Irving BurtonEmily Senior RDCS Referring Phys: [phone] Campbell LernerJANINE HAMMOND IMPRESSIONS  1. Left ventricular ejection fraction, by visual estimation, is 60 to 65%. There is no increased left ventricular wall thickness.  2. The left ventricle has no regional wall motion abnormalities.  3. Global right ventricle has normal systolc function.The right ventricular size is normal. no increase in right ventricular wall thickness.  4. Findings are consistent with cardiac tamponade.  5. Large pericardial effusion.  6. There is excessive respiratory variation in the tricuspid valve spectral Doppler velocities, excessive respiratory variation in the mitral valve spectral Doppler velocities, inversion of the right atrial wall and inversion of the right ventricular wall.  7. Mildly elevated pulmonary artery systolic pressure.  8. The inferior vena cava is dilated in size with <50% respiratory variability, suggesting right atrial pressure of 15 mmHg.  9. Compared to the study performed just 12 hours earlier, the pericardial effusion appears slightly larger and the signs of tamponade are even better defined. 10. Prior images reviewed side by side. FINDINGS  Left Ventricle: Left ventricular ejection fraction, by visual estimation, is 60 to 65%. The left ventricle has no regional wall motion abnormalities. The left ventricular internal cavity  size was the left ventricle is normal in size. There is no increased left ventricular wall thickness. Right Ventricle: The right ventricular size is normal. No increase in right ventricular wall thickness. Global RV systolic function is has normal systolic function. The tricuspid regurgitant velocity is 2.16 m/s, and with an assumed right atrial pressure  of 15 mmHg, the estimated right ventricular systolic pressure is mildly elevated at 33.7 mmHg. Left Atrium: Left atrial size was normal in size. Right Atrium: Right atrial size was normal in size. Right atrial pressure is estimated at 15 mmHg. Pericardium: A large pericardial effusion is present is seen. A large pericardial effusion is present. There is excessive respiratory variation in the tricuspid valve spectral Doppler velocities, excessive respiratory variation in the mitral valve spectral Doppler velocities, inversion of the right atrial wall and inversion of the right ventricular wall. There is evidence of cardiac tamponade. Venous: The inferior vena cava is dilated in size with less than 50% respiratory variability, suggesting right atrial pressure of 15 mmHg.  TRICUSPID VALVE             Normals TR Peak grad:   18.7 mmHg TR Vmax:        216.00 cm/s 288 cm/s  Craig FairMihai Croitoru MD Electronically signed by Craig FairMihai Croitoru  MD Signature Date/Time: 09/03/2019/10:42:51 AM    Final    Disposition   Pt is being discharged home today in good condition.  Follow-up Plans & Appointments    Follow-up Information    [facility] [address] Follow up on 09/18/2019.   Specialty: Cardiology Why: 4:05 pm for limited echo Contact information: [address] [id number] [place] [id number] [phone]       Marcelino Dusteruke, Antanette Richwine Nicole, PA Follow up on 09/27/2019.   Specialties: Physician Assistant, Cardiology, Radiology Why: 2:15 Contact information: [address] [place] [id number] [phone]         [facility] Follow up on 09/18/2019.   Specialty: Cardiology Why: Please have labs drawn at echo appt Contact information: [address], [address] [place] [id number] [phone]         Discharge Instructions    Diet - low sodium heart healthy   Complete by: As directed    Increase activity slowly   Complete by: As directed       Discharge Medications   Allergies as of 09/04/2019   No Known Allergies     Medication List    TAKE these medications   colchicine 0.6 MG tablet Take 1 tablet (0.6 mg total) by mouth daily.   ibuprofen 800 MG tablet Commonly known as: ADVIL Take 1 tablet (800 mg total) by mouth 3 (three) times daily.   pantoprazole 40 MG tablet Commonly known as: PROTONIX Take 1 tablet (40 mg total) by mouth daily. Start taking on: September 05, 2019          Outstanding Labs/Studies   As above - labs and echo  Duration of Discharge Encounter   Greater than 30 minutes including physician time.  Signed, Roe RutherfordAngela Nicole Tearsa Kowalewski, PA 09/04/2019, 1:06 PM

## 2019-09-04 NOTE — Plan of Care (Signed)
Patient is ready for discharge.  All questions answered.

## 2019-09-05 LAB — CYTOLOGY - NON PAP

## 2019-09-06 ENCOUNTER — Encounter: Payer: Self-pay | Admitting: *Deleted

## 2019-09-08 LAB — CULTURE, BODY FLUID W GRAM STAIN -BOTTLE: Culture: NO GROWTH

## 2019-09-14 ENCOUNTER — Emergency Department (HOSPITAL_BASED_OUTPATIENT_CLINIC_OR_DEPARTMENT_OTHER)
Admission: EM | Admit: 2019-09-14 | Discharge: 2019-09-14 | Disposition: A | Discharge status: Home / Self Care | Attending: Emergency Medicine | Admitting: Emergency Medicine

## 2019-09-14 ENCOUNTER — Emergency Department (HOSPITAL_BASED_OUTPATIENT_CLINIC_OR_DEPARTMENT_OTHER)

## 2019-09-14 ENCOUNTER — Other Ambulatory Visit: Payer: Self-pay

## 2019-09-14 ENCOUNTER — Encounter (HOSPITAL_BASED_OUTPATIENT_CLINIC_OR_DEPARTMENT_OTHER): Payer: Self-pay | Admitting: Emergency Medicine

## 2019-09-14 DIAGNOSIS — R0602 Shortness of breath: Secondary | ICD-10-CM

## 2019-09-14 DIAGNOSIS — R072 Precordial pain: Secondary | ICD-10-CM | POA: Diagnosis not present

## 2019-09-14 DIAGNOSIS — R0789 Other chest pain: Secondary | ICD-10-CM | POA: Diagnosis present

## 2019-09-14 LAB — CBC WITH DIFFERENTIAL/PLATELET
Abs Immature Granulocytes: 0.03 10*3/uL (ref 0.00–0.07)
Basophils Absolute: 0 10*3/uL (ref 0.0–0.1)
Basophils Relative: 1 %
Eosinophils Absolute: 0.2 10*3/uL (ref 0.0–0.5)
Eosinophils Relative: 4 %
HCT: 39 % (ref 39.0–52.0)
Hemoglobin: 12.2 g/dL — ABNORMAL LOW (ref 13.0–17.0)
Immature Granulocytes: 1 %
Lymphocytes Relative: 31 %
Lymphs Abs: 1.8 10*3/uL (ref 0.7–4.0)
MCH: 25.8 pg — ABNORMAL LOW (ref 26.0–34.0)
MCHC: 31.3 g/dL (ref 30.0–36.0)
MCV: 82.5 fL (ref 80.0–100.0)
Monocytes Absolute: 0.9 10*3/uL (ref 0.1–1.0)
Monocytes Relative: 15 %
Neutro Abs: 2.8 10*3/uL (ref 1.7–7.7)
Neutrophils Relative %: 48 %
Platelets: 263 10*3/uL (ref 150–400)
RBC: 4.73 MIL/uL (ref 4.22–5.81)
RDW: 14.5 % (ref 11.5–15.5)
WBC: 5.8 10*3/uL (ref 4.0–10.5)
nRBC: 0 % (ref 0.0–0.2)

## 2019-09-14 LAB — D-DIMER, QUANTITATIVE: D-Dimer, Quant: 3.81 ug/mL-FEU — ABNORMAL HIGH (ref 0.00–0.50)

## 2019-09-14 LAB — BASIC METABOLIC PANEL
Anion gap: 6 (ref 5–15)
BUN: 14 mg/dL (ref 6–20)
CO2: 28 mmol/L (ref 22–32)
Calcium: 8.7 mg/dL — ABNORMAL LOW (ref 8.9–10.3)
Chloride: 103 mmol/L (ref 98–111)
Creatinine, Ser: 1.08 mg/dL (ref 0.61–1.24)
GFR calc Af Amer: >60 mL/min (ref >60)
GFR calc non Af Amer: >60 mL/min (ref >60)
Glucose, Bld: 94 mg/dL (ref 70–99)
Potassium: 4 mmol/L (ref 3.5–5.1)
Sodium: 137 mmol/L (ref 135–145)

## 2019-09-14 LAB — TROPONIN I (HIGH SENSITIVITY)
Troponin I (High Sensitivity): 2 ng/L (ref <18)
Troponin I (High Sensitivity): <2 ng/L (ref <18)

## 2019-09-14 LAB — BRAIN NATRIURETIC PEPTIDE: B Natriuretic Peptide: 32.1 pg/mL (ref 0.0–100.0)

## 2019-09-14 MED ORDER — IOHEXOL 350 MG/ML SOLN
100.0000 mL | Freq: Once | INTRAVENOUS | Status: AC | PRN
  Administered 2019-09-14: 100 mL via INTRAVENOUS

## 2019-09-14 NOTE — Progress Notes (Signed)
Patient ambulated around the room at a quick pace while on pulse ox.  Upon return to the room the patients HR was 95 and SPO2 was 94%.  Patient state he felt a little winded but was not dizzy.

## 2019-09-14 NOTE — ED Provider Notes (Signed)
[facility] EMERGENCY DEPARTMENT Provider Note   CSN: [id number] Arrival date & time: 09/14/19  2020     History Chief Complaint  Patient presents with  . Chest Pain  . Shortness of Breath    Craig Benjamin is a 54 y.o. male.  Patient with no significant past medical history prior to 09/02/2019 when he was admitted to the hospital with a large pericardial effusion and tamponade.  Patient was seen in this emergency department and I participated in patient's initial care.  He was admitted to the hospital and underwent pericardiocentesis the following day.  He had a drain in overnight which was removed prior to discharge.  Patient was discharged home on colchicine, ibuprofen and Protonix with diagnosis of acute pericarditis.  Patient states that since that time he had been feeling much better.  He has returned to light exercise.  At approximately 7 PM tonight he was watching a movie with his wife and he developed relatively acute onset of right sided chest pain and exertional shortness of breath.  Shortness of breath is also worsened when he lies back.  He denies any fevers or cough.  No lower extremity swelling.  No diaphoresis, vomiting, or diarrhea.  Patient states that the pain is actually more severe than when he presented with left-sided chest and flank pain but does note that it is on the opposite side today.  No treatments prior to arrival.        History reviewed. No pertinent past medical history.  Patient Active Problem List   Diagnosis Date Noted  . Pericardial tamponade   . Pericardial effusion 09/02/2019    Past Surgical History:  Procedure Laterality Date  . HERNIA REPAIR    . PERICARDIOCENTESIS N/A 09/03/2019   Procedure: PERICARDIOCENTESIS;  Surgeon: Kathleene Hazel, MD;  Location: [facility] INVASIVE CV LAB;  Service: Cardiovascular;  Laterality: N/A;       History reviewed. No pertinent family history.  Social History   Tobacco Use  . Smoking  status: Never Smoker  . Smokeless tobacco: Never Used  Substance Use Topics  . Alcohol use: Not Currently    Comment: occ  . Drug use: Not on file    Home Medications Prior to Admission medications   Medication Sig Start Date End Date Taking? Authorizing Provider  colchicine 0.6 MG tablet Take 1 tablet (0.6 mg total) by mouth daily. 09/04/19   [facility], Roe Rutherford, PA  ibuprofen (ADVIL) 800 MG tablet Take 1 tablet (800 mg total) by mouth 3 (three) times daily. 09/04/19   [facility], Roe Rutherford, PA  pantoprazole (PROTONIX) 40 MG tablet Take 1 tablet (40 mg total) by mouth daily. 09/05/19   Marcelino Duster, PA    Allergies    Patient has no known allergies.  Review of Systems   Review of Systems  Constitutional: Negative for diaphoresis and fever.  Eyes: Negative for redness.  Respiratory: Positive for shortness of breath. Negative for cough.   Cardiovascular: Positive for chest pain. Negative for palpitations and leg swelling.  Gastrointestinal: Negative for abdominal pain, nausea and vomiting.  Genitourinary: Negative for dysuria.  Musculoskeletal: Negative for back pain and neck pain.  Skin: Negative for rash.  Neurological: Negative for syncope and light-headedness.  Psychiatric/Behavioral: The patient is not nervous/anxious.     Physical Exam Updated Vital Signs BP (!) 151/107 (BP Location: Left Arm)   Pulse 75   Temp 98.1 F (36.7 C) (Oral)   Resp 20   SpO2 99%  Physical Exam Vitals and nursing note reviewed.  Constitutional:      Appearance: He is well-developed. He is not diaphoretic.  HENT:     Head: Normocephalic and atraumatic.     Mouth/Throat:     Mouth: Mucous membranes are not dry.  Eyes:     Conjunctiva/sclera: Conjunctivae normal.  Neck:     Vascular: Normal carotid pulses. No carotid bruit or JVD.     Trachea: Trachea normal. No tracheal deviation.  Cardiovascular:     Rate and Rhythm: Normal rate and regular rhythm.     Pulses: No decreased  pulses.     Heart sounds: Normal heart sounds, S1 normal and S2 normal. Heart sounds not distant. No murmur. No friction rub.  Pulmonary:     Effort: Pulmonary effort is normal. No respiratory distress.     Breath sounds: Normal breath sounds. No wheezing.     Comments: Reports SOB lying flat.  Chest:     Chest wall: No tenderness.  Abdominal:     General: Bowel sounds are normal.     Palpations: Abdomen is soft.     Tenderness: There is no abdominal tenderness. There is no guarding or rebound.  Musculoskeletal:     Cervical back: Normal range of motion and neck supple. No muscular tenderness.  Skin:    General: Skin is warm and dry.     Coloration: Skin is not pale.  Neurological:     Mental Status: He is alert.     ED Results / Procedures / Treatments   Labs (all labs ordered are listed, but only abnormal results are displayed) Labs Reviewed  CBC WITH DIFFERENTIAL/PLATELET - Abnormal; Notable for the following components:      Result Value   Hemoglobin 12.2 (*)    MCH 25.8 (*)    All other components within normal limits  BASIC METABOLIC PANEL - Abnormal; Notable for the following components:   Calcium 8.7 (*)    All other components within normal limits  D-DIMER, QUANTITATIVE (NOT AT [facility]) - Abnormal; Notable for the following components:   D-Dimer, Quant 3.81 (*)    All other components within normal limits  BRAIN NATRIURETIC PEPTIDE  TROPONIN I (HIGH SENSITIVITY)  TROPONIN I (HIGH SENSITIVITY)    EKG None  Radiology CT Angio Chest PE W and/or Wo Contrast  Result Date: 09/14/2019 CLINICAL DATA:  Shortness of breath.  Elevated D-dimer. EXAM: CT ANGIOGRAPHY CHEST WITH CONTRAST TECHNIQUE: Multidetector CT imaging of the chest was performed using the standard protocol during bolus administration of intravenous contrast. Multiplanar CT image reconstructions and MIPs were obtained to evaluate the vascular anatomy. CONTRAST:  186mL OMNIPAQUE IOHEXOL 350 MG/ML SOLN  COMPARISON:  None. FINDINGS: Cardiovascular: Evaluation for pulmonary emboli is significantly limited by contrast timing and extensive respiratory motion artifact.Given the significant limitations, no acute pulmonary embolism was detected. The main pulmonary artery is within normal limits for size. There is no CT evidence of acute right heart strain. The visualized aorta is normal. Heart size is mildly enlarged. There is a small to moderate-sized pericardial effusion. Mediastinum/Nodes: --No mediastinal or hilar lymphadenopathy. --No axillary lymphadenopathy. --No supraclavicular lymphadenopathy. --Normal thyroid gland. --The esophagus is unremarkable Lungs/Pleura: There are small bilateral pleural effusions, left greater than right. Bibasilar atelectasis is noted, left worse than right. There is no pneumothorax. No focal infiltrate. The trachea is unremarkable. Upper Abdomen: No acute abnormality. Musculoskeletal: No chest wall abnormality. No acute or significant osseous findings. Review of the MIP images confirms the  above findings. IMPRESSION: 1. Very limited study for the detection of pulmonary emboli. There is no large centrally located pulmonary embolus. Detection of smaller pulmonary emboli is severely limited. 2. Mild cardiomegaly with a small to moderate-sized pericardial effusion. 3. Small bilateral pleural effusions, left greater than right. 4. Bibasilar atelectasis. Electronically Signed   By: Katherine Mantle M.D.   On: 09/14/2019 22:25   DG Chest Port 1 View  Result Date: 09/14/2019 CLINICAL DATA:  Chest pain with breathing. EXAM: PORTABLE CHEST 1 VIEW COMPARISON:  September 02, 2019 FINDINGS: Mild atelectasis and/or infiltrate is seen within the left lung base. This is decreased in severity when compared to the prior study. A small left pleural effusion is noted. No pneumothorax is identified. The cardiac silhouette is markedly enlarged unchanged in size. The visualized skeletal structures are  unremarkable. IMPRESSION: 1. Mild left basilar atelectasis and/or infiltrate, decreased in severity when compared to the prior study dated September 02, 2019. 2. Small left pleural effusion. Electronically Signed   By: Aram Candela M.D.   On: 09/14/2019 21:15    Procedures Procedures (including critical care time)  Medications Ordered in ED Medications - No data to display  ED Course  I have reviewed the triage vital signs and the nursing notes.  Pertinent labs & imaging results that were available during my care of the patient were reviewed by me and considered in my medical decision making (see chart for details).  Patient seen and examined. Work-up initiated. Pt appears uncomfortable but stable. Bedside echo improved from 1/18. Small pericardial effusion seen.   Vital signs reviewed and are as follows: BP (!) 151/107 (BP Location: Left Arm)   Pulse 75   Temp 98.1 F (36.7 C) (Oral)   Resp 20   SpO2 99%     EMERGENCY DEPARTMENT [place] CARDIAC EXAM "Study: Limited Ultrasound of the Heart and Pericardium"  INDICATIONS:Chest pain, Dyspnea and h/o pericardial effusion/tamponade Multiple views of the heart and pericardium were obtained in real-time with a multi-frequency probe.  PERFORMED DV:VOHYWV IMAGES ARCHIVED?: Yes LIMITATIONS:  None VIEWS USED: Subcostal 4 chamber, Parasternal long axis, Parasternal short axis, Apical 4 chamber  and Inferior Vena Cava INTERPRETATION: Cardiac activity present, Pericardial effusion present, Amount of pericardial effusion trace, Cardiac tamponade absent, Normal contractility and IVC normal  D-dimer elevated. CT angio completed. Awaiting ambulation and 2nd trop. Updated patient and wife.   Dr. Stevie Kern to follow-up on results.     MDM Rules/Calculators/A&P                      Pending completion of work-up.   Final Clinical Impression(s) / ED Diagnoses Final diagnoses:  Precordial pain  Shortness of breath    Rx / DC Orders ED  Discharge Orders    None       Renne Crigler, PA-C 09/14/19 [id number]    Milagros Loll, MD 09/15/19 [phone]

## 2019-09-14 NOTE — ED Triage Notes (Signed)
Pt states that he was just released from the hospital for a pericardial effusion where he has a pericardial drainage. The patient states that states that he now is having Chest pain and SOB  - increased pain when he takes a deep breath.

## 2019-09-14 NOTE — ED Notes (Signed)
Returned from CT scan.

## 2019-09-18 ENCOUNTER — Ambulatory Visit (HOSPITAL_COMMUNITY): Attending: Cardiology

## 2019-09-18 ENCOUNTER — Other Ambulatory Visit: Payer: Self-pay

## 2019-09-18 DIAGNOSIS — I3139 Other pericardial effusion (noninflammatory): Secondary | ICD-10-CM

## 2019-09-18 DIAGNOSIS — I313 Pericardial effusion (noninflammatory): Secondary | ICD-10-CM | POA: Diagnosis present

## 2019-09-21 MED ORDER — IBUPROFEN 400 MG PO TABS
800.00 | ORAL_TABLET | ORAL | Status: DC
Start: 2019-09-21 — End: 2019-09-21

## 2019-09-21 MED ORDER — COLCHICINE 0.6 MG PO CAPS
0.60 | ORAL_CAPSULE | ORAL | Status: DC
Start: 2019-09-22 — End: 2019-09-21

## 2019-09-21 MED ORDER — PANTOPRAZOLE SODIUM 40 MG PO TBEC
40.00 | DELAYED_RELEASE_TABLET | ORAL | Status: DC
Start: 2019-09-22 — End: 2019-09-21

## 2019-09-26 NOTE — Progress Notes (Signed)
Cardiology Office Note:    Date:  09/27/2019   ID:  Craig Benjamin, DOB [birth year], MRN [id number]  PCP:  Jonathon Resides, MD  Cardiologist:  Sanda Klein, MD   Referring MD: Jonathon Resides, MD   Chief Complaint  Patient presents with  . Follow-up  pericarditis s/p pericardiocentesis Pleural effusion s/p thoracentesis  History of Present Illness:    Craig Benjamin is a 54 y.o. male with no prior cardiac history before a recent hospitalization in which he presented with chest pain and shortness of breath found to have pericardial effusion with tamponade physiology.  Presented on 09/02/2019 with the above symptoms and history of acute viral illness with low-grade fever. Echocardiogram revealed large circumferential pericardial effusion with multiple signs of tamponade. He underwent pericardiocentesis with removal of 1020 cc of fluid. He tolerated the procedure well. Inflammatory markers were elevated and viral pericarditis was suspected. He was discharged on colchicine and ibuprofen with protonix 09/04/19.   Unfortunately, he presented back to ER on 09/14/19 with right sided chest pain and SOB. Bedside echo did not show significant effusion. CTA was negative for PE and troponin x 2 negative. CXR with small left pleural effusion. He was discharged without admission.  Follow up echo that had been arranged at initial discharge completed on 09/18/19 and showed trivial pericardial effusion. Echo did show large pleural effusion. CXR on 09/14/19 with small pleural effusion.  He was not short of breath at that time.  Unfortunately, he did not have labs drawn.  On 09/19/19, he reports "not feeling right" and thought his cards follow up was too far away, so he prsented to his PCP. PCP performed walking O2 sats which were in the 80% range. In addition, his D-dimer was significantly elevated, so he was sent to the ER. CTA was negative for PE. Bedside thoracentesis was performed with ~600 cc removed from left  pleural space. He was discharged from the ER without admission. CRP was still elevated at 160.8, Sed rate and RA factor were negative. Troponin negative. Pleural effusion was exudative and felt to be consistent with pericarditis, but could not rule out malignancy and recommended repeat CT in 4-6 weeks.   He presents today for follow up. He feels well - walked three miles this morning. He will resume running next week. He is still taking colchicine 0.6 mg and 800 mg ibuprofen TID. He denies SOB and chest pain.   Of note, he saw urology for back pain, suspected kidney stone. He was recently told his PSA was elevated.   History reviewed. No pertinent past medical history.  Past Surgical History:  Procedure Laterality Date  . HERNIA REPAIR    . PERICARDIOCENTESIS N/A 09/03/2019   Procedure: PERICARDIOCENTESIS;  Surgeon: Burnell Blanks, MD;  Location: [facility];  Service: Cardiovascular;  Laterality: N/A;    Current Medications: Current Meds  Medication Sig  . colchicine 0.6 MG tablet Take 1 tablet (0.6 mg total) by mouth daily.  Marland Kitchen ibuprofen (ADVIL) 800 MG tablet Take 1 tablet (800 mg total) by mouth 3 (three) times daily.  . pantoprazole (PROTONIX) 40 MG tablet Take 1 tablet (40 mg total) by mouth daily.     Allergies:   Patient has no known allergies.   Social History   Socioeconomic History  . Marital status: Married    Spouse name: Not on file  . Number of children: Not on file  . Years of education: Not on file  . Highest education level: Not  on file  Occupational History  . Not on file  Tobacco Use  . Smoking status: Never Smoker  . Smokeless tobacco: Never Used  Substance and Sexual Activity  . Alcohol use: Not Currently    Comment: occ  . Drug use: Not on file  . Sexual activity: Not on file  Other Topics Concern  . Not on file  Social History Narrative  . Not on file   Social Determinants of Health   Financial Resource Strain:   . Difficulty of  Paying Living Expenses: Not on file  Food Insecurity:   . Worried About Charity fundraiser in the Last Year: Not on file  . Ran Out of Food in the Last Year: Not on file  Transportation Needs:   . Lack of Transportation (Medical): Not on file  . Lack of Transportation (Non-Medical): Not on file  Physical Activity:   . Days of Exercise per Week: Not on file  . Minutes of Exercise per Session: Not on file  Stress:   . Feeling of Stress : Not on file  Social Connections:   . Frequency of Communication with Friends and Family: Not on file  . Frequency of Social Gatherings with Friends and Family: Not on file  . Attends Religious Services: Not on file  . Active Member of Clubs or Organizations: Not on file  . Attends Archivist Meetings: Not on file  . Marital Status: Not on file     Family History: The patient's family history is not on file.  ROS:   Please see the history of present illness.     All other systems reviewed and are negative.  EKGs/Labs/Other Studies Reviewed:    The following studies were reviewed today:  Echo 09/18/19: 1. Left ventricular ejection fraction, by visual estimation, is 60 to  65%. The left ventricle has normal function. There is no increased left  ventricular wall thickness.  2. Left ventricular diastolic parameters are indeterminate.  3. Global right ventricle has normal systolc function.The right  ventricular size is normal. no increase in right ventricular wall  thickness.  4. Left atrial size was mildly dilated.  5. Large pleural effusion in the left lateral region.  6. Trivial pericardial effusion is present.  7. The mitral valve is normal in structure. No evidence of mitral valve  regurgitation. No evidence of mitral stenosis.  8. The tricuspid valve was normal in structure. Tricuspid valve  regurgitation is trivial.  9. Tricuspid valve regurgitation is trivial.  10. No evidence of aortic valve sclerosis or stenosis.    11. The pulmonic valve was normal in structure.  12. Mildly elevated pulmonary artery systolic pressure.  13. The inferior vena cava is normal in size with greater than 50%  respiratory variability, suggesting right atrial pressure of 3 mmHg.   EKG:  EKG is  ordered today.  The ekg ordered today demonstrates sinus bradycardia with HR 57, nonspecific ST changes similar to prior  Recent Labs: 09/02/2019: ALT 37 09/14/2019: B Natriuretic Peptide 32.1; BUN 14; Creatinine, Ser 1.08; Hemoglobin 12.2; Platelets 263; Potassium 4.0; Sodium 137  Recent Lipid Panel No results found for: CHOL, TRIG, HDL, CHOLHDL, VLDL, LDLCALC, LDLDIRECT  Physical Exam:    VS:  BP (!) 136/100 (BP Location: Left Arm, Patient Position: Sitting, Cuff Size: Normal)   Pulse (!) 57   Temp (!) 97.3 F (36.3 C)   Ht _0  (1.753 m)   Wt 181 lb (82.1 kg)   BMI 26.73  kg/m     Wt Readings from Last 3 Encounters:  09/27/19 181 lb (82.1 kg)  09/04/19 184 lb 1.4 oz (83.5 kg)     GEN: Well nourished, well developed in no acute distress HEENT: Normal NECK: No JVD; No carotid bruits LYMPHATICS: No lymphadenopathy CARDIAC: RRR, no murmurs, rubs, gallops RESPIRATORY:  Clear to auscultation without rales, wheezing or rhonchi  ABDOMEN: Soft, non-tender, non-distended MUSCULOSKELETAL:  No edema; No deformity  SKIN: Warm and dry NEUROLOGIC:  Alert and oriented x 3 PSYCHIATRIC:  Normal affect   ASSESSMENT:    1. Acute idiopathic pericarditis   2. Pericardial tamponade   3. S/P pericardiocentesis   4. S/P thoracentesis    PLAN:    In order of problems listed above:  Pericarditis - viral Pericardial effusion with tamponad s/p pericardiocentesis - 09/03/19 Pleural effusion s/p thoracentesis - 09/19/19 - will repeat sed rate and CRP - no further chest pain or SOB - given his recurrence of fluid, I recommended continuing ibuprofen for one more week and continuing colchicine (protonix for PPI) - will redraw CRP today,  sed rate and RA were normal on 09/19/19 - pleural effusion was exudative, consistent with viral pericarditis, but malignancy can't be ruled out -- in addition, recently elevated PSA    Medication Adjustments/Labs and Tests Ordered: Current medicines are reviewed at length with the patient today.  Concerns regarding medicines are outlined above.  Orders Placed This Encounter  Procedures  . C-reactive protein  . Sed Rate (ESR)  . EKG 12-Lead   No orders of the defined types were placed in this encounter.   Signed, Ledora Bottcher, PA  09/27/2019 4:10 PM    [facility]

## 2019-09-27 ENCOUNTER — Other Ambulatory Visit: Payer: Self-pay

## 2019-09-27 ENCOUNTER — Encounter: Payer: Self-pay | Admitting: Physician Assistant

## 2019-09-27 ENCOUNTER — Ambulatory Visit (INDEPENDENT_AMBULATORY_CARE_PROVIDER_SITE_OTHER): Admitting: Physician Assistant

## 2019-09-27 VITALS — BP 136/100 | HR 57 | Temp 97.3°F | Ht 69.0 in | Wt 181.0 lb

## 2019-09-27 DIAGNOSIS — I314 Cardiac tamponade: Secondary | ICD-10-CM

## 2019-09-27 DIAGNOSIS — Z9889 Other specified postprocedural states: Secondary | ICD-10-CM | POA: Diagnosis not present

## 2019-09-27 DIAGNOSIS — I300 Acute nonspecific idiopathic pericarditis: Secondary | ICD-10-CM

## 2019-09-27 NOTE — Patient Instructions (Signed)
Medication Instructions:  Your physician recommends that you continue on your current medications as directed. Please refer to the Current Medication list given to you today.  *If you need a refill on your cardiac medications before your next appointment, please call your pharmacy*  Lab Work: Your physician recommends that you return for lab work today:  CRP  SED RATE If you have labs (blood work) drawn today and your tests are completely normal, you will receive your results only by: Marland Kitchen MyChart Message (if you have MyChart) OR . A paper copy in the mail If you have any lab test that is abnormal or we need to change your treatment, we will call you to review the results.  Testing/Procedures: NONE ordered at this time of appointment   Follow-Up: At [facility], you and your health needs are our priority.  As part of our continuing mission to provide you with exceptional heart care, we have created designated Provider Care Teams.  These Care Teams include your primary Cardiologist (physician) and Advanced Practice Providers (APPs -  Physician Assistants and Nurse Practitioners) who all work together to provide you with the care you need, when you need it.  Your next appointment:   4 week(s)  The format for your next appointment:   Virtual Visit   Provider:   Thurmon Fair, MD  Other Instructions

## 2019-09-27 NOTE — Progress Notes (Signed)
Thanks and agree. NSAIDs and colchicine are appropriate. Suspect pericarditis and pleuritis have same etiology. Both fluid samples were negative for malignancy. PSA is indeed high and should be investigated further. Prostate cancer metastatic to the pericardium and pleura is definitely possible, but not at all common, especially without evidence of lymphatic or bone involvement first.

## 2019-09-28 LAB — SEDIMENTATION RATE: Sed Rate: 21 mm/hr (ref 0–30)

## 2019-09-28 LAB — C-REACTIVE PROTEIN: CRP: 13 mg/L — ABNORMAL HIGH (ref 0–10)

## 2019-10-23 ENCOUNTER — Telehealth: Payer: Self-pay | Admitting: *Deleted

## 2019-10-23 ENCOUNTER — Encounter: Payer: Self-pay | Admitting: Cardiovascular Disease

## 2019-10-23 ENCOUNTER — Telehealth (INDEPENDENT_AMBULATORY_CARE_PROVIDER_SITE_OTHER): Admitting: Cardiovascular Disease

## 2019-10-23 DIAGNOSIS — I301 Infective pericarditis: Secondary | ICD-10-CM | POA: Diagnosis not present

## 2019-10-23 DIAGNOSIS — I313 Pericardial effusion (noninflammatory): Secondary | ICD-10-CM | POA: Diagnosis not present

## 2019-10-23 DIAGNOSIS — I314 Cardiac tamponade: Secondary | ICD-10-CM | POA: Diagnosis not present

## 2019-10-23 DIAGNOSIS — I3139 Other pericardial effusion (noninflammatory): Secondary | ICD-10-CM

## 2019-10-23 NOTE — Telephone Encounter (Signed)
The patient has been called about the virtual appointment today with Dr. Croitoru. Instructions provided. The AVS will be mailed. The patient verbalized their understanding.  

## 2019-10-23 NOTE — Telephone Encounter (Signed)
Virtual Visit Pre-Appointment Phone Call  "(Name), I am calling you today to discuss your upcoming appointment. We are currently trying to limit exposure to the virus that causes COVID-19 by seeing patients at home rather than in the office."  1. "What is the BEST phone number to call the day of the visit?" - include this in appointment notes  2. "Do you have or have access to (through a family member/friend) a smartphone with video capability that we can use for your visit?" a. If yes - list this number in appt notes as "cell" (if different from BEST phone #) and list the appointment type as a VIDEO visit in appointment notes b. If no - list the appointment type as a PHONE visit in appointment notes  Confirm consent - "In the setting of the current Covid19 crisis, you are scheduled for a (phone or video) visit with your provider on (date) at (time).  Just as we do with many in-office visits, in order for you to participate in this visit, we must obtain consent.  If you'd like, I can send this to your mychart (if signed up) or email for you to review.  Otherwise, I can obtain your verbal consent now.  All virtual visits are billed to your insurance company just like a normal visit would be.  By agreeing to a virtual visit, we'd like you to understand that the technology does not allow for your provider to perform an examination, and thus may limit your provider's ability to fully assess your condition. If your provider identifies any concerns that need to be evaluated in person, we will make arrangements to do so.  Finally, though the technology is pretty good, we cannot assure that it will always work on either your or our end, and in the setting of a video visit, we may have to convert it to a phone-only visit.  In either situation, we cannot ensure that we have a secure connection.  Are you willing to proceed?" YES 3. Advise patient to be prepared - "Two hours prior to your appointment, go ahead  and check your blood pressure, pulse, oxygen saturation, and your weight (if you have the equipment to check those) and write them all down. When your visit starts, your provider will ask you for this information. If you have an Apple Watch or Kardia device, please plan to have heart rate information ready on the day of your appointment. Please have a pen and paper handy nearby the day of the visit as well."  4. Give patient instructions for MyChart download to smartphone OR [email or web address].me as below if video visit (depending on what platform provider is using)  5. Inform patient they will receive a phone call 15 minutes prior to their appointment time (may be from unknown caller ID) so they should be prepared to answer    TELEPHONE CALL NOTE  Craig Benjamin has been deemed a candidate for a follow-up tele-health visit to limit community exposure during the Covid-19 pandemic. I spoke with the patient via phone to ensure availability of phone/video source, confirm preferred email & phone number, and discuss instructions and expectations.  I reminded Craig Benjamin to be prepared with any vital sign and/or heart rhythm information that could potentially be obtained via home monitoring, at the time of his visit. I reminded Craig Benjamin to expect a phone call prior to his visit.  Sandi Mariscal, RN 10/23/2019 5:17 PM   INSTRUCTIONS FOR DOWNLOADING THE MYCHART APP  TO SMARTPHONE  - The patient must first make sure to have activated MyChart and know their login information - If Apple, go to [employer] and type in MyChart in the search bar and download the app. If Android, ask patient to go to [employer] and type in [place] in the search bar and download the app. The app is free but as with any other app downloads, their phone may require them to verify saved payment information or Apple/Android password.  - The patient will need to then log into the app with their MyChart username and  password, and select [facility] as their healthcare provider to link the account. When it is time for your visit, go to the MyChart app, find appointments, and click Begin Video Visit. Be sure to Select Allow for your device to access the Microphone and Camera for your visit. You will then be connected, and your provider will be with you shortly.  **If they have any issues connecting, or need assistance please contact MyChart service desk [phone])**  **If using a computer, in order to ensure the best quality for their visit they will need to use either of the following Internet Browsers: [employer], or Google Chrome**  IF USING DOXIMITY or DOXY.ME - The patient will receive a link just prior to their visit by text.     FULL LENGTH CONSENT FOR TELE-HEALTH VISIT   I hereby voluntarily request, consent and authorize [facility] and its employed or contracted physicians, physician assistants, nurse practitioners or other licensed health care professionals (the Practitioner), to provide me with telemedicine health care services (the "Services") as deemed necessary by the treating Practitioner. I acknowledge and consent to receive the Services by the Practitioner via telemedicine. I understand that the telemedicine visit will involve communicating with the Practitioner through live audiovisual communication technology and the disclosure of certain medical information by electronic transmission. I acknowledge that I have been given the opportunity to request an in-person assessment or other available alternative prior to the telemedicine visit and am voluntarily participating in the telemedicine visit.  I understand that I have the right to withhold or withdraw my consent to the use of telemedicine in the course of my care at any time, without affecting my right to future care or treatment, and that the Practitioner or I may terminate the telemedicine visit at any time. I  understand that I have the right to inspect all information obtained and/or recorded in the course of the telemedicine visit and may receive copies of available information for a reasonable fee.  I understand that some of the potential risks of receiving the Services via telemedicine include:  Marland Kitchen Delay or interruption in medical evaluation due to technological equipment failure or disruption; . Information transmitted may not be sufficient (e.g. poor resolution of images) to allow for appropriate medical decision making by the Practitioner; and/or  . In rare instances, security protocols could fail, causing a breach of personal health information.  Furthermore, I acknowledge that it is my responsibility to provide information about my medical history, conditions and care that is complete and accurate to the best of my ability. I acknowledge that Practitioner's advice, recommendations, and/or decision may be based on factors not within their control, such as incomplete or inaccurate data provided by me or distortions of diagnostic images or specimens that may result from electronic transmissions. I understand that the practice of medicine is not an exact science and that Practitioner makes no warranties  or guarantees regarding treatment outcomes. I acknowledge that I will receive a copy of this consent concurrently upon execution via email to the email address I last provided but may also request a printed copy by calling the office of [facility].    I understand that my insurance will be billed for this visit.   I have read or had this consent read to me. . I understand the contents of this consent, which adequately explains the benefits and risks of the Services being provided via telemedicine.  . I have been provided ample opportunity to ask questions regarding this consent and the Services and have had my questions answered to my satisfaction. . I give my informed consent for the services to be  provided through the use of telemedicine in my medical care  By participating in this telemedicine visit I agree to the above.

## 2019-10-23 NOTE — Progress Notes (Signed)
Virtual Visit via Video Note   This visit type was conducted due to national recommendations for restrictions regarding the COVID-19 Pandemic (e.g. social distancing) in an effort to limit this patient's exposure and mitigate transmission in our community.  Due to his co-morbid illnesses, this patient is at least at moderate risk for complications without adequate follow up.  This format is felt to be most appropriate for this patient at this time.  All issues noted in this document were discussed and addressed.  A limited physical exam was performed with this format.  Please refer to the patient's chart for his consent to telehealth for [facility].   The patient was identified using 2 identifiers.  Date:  10/23/2019   ID:  Craig Benjamin, DOB 1966-04-16, MRN [id number]  Patient Location: Home Provider Location: Home  PCP:  Jonathon Resides, MD  Cardiologist:  Sanda Klein, MD  Electrophysiologist:  None   Evaluation Performed:  Follow-Up Visit  Chief Complaint:  F/U pericarditis  History of Present Illness:    Craig Benjamin is a 54 y.o. 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  He later developed pleuritis and required thoracentesis.    He is doing better.  He started running again and only notices some chest tightness when he really pushes it.  Oxygen saturation has been consistently in the high 90% range.  Screening studies for rheumatological disorders were negative.  Cytology on fluid was unremarkable.  Chest CT on September 14, 2019 was unremarkable except for the presence of pericardial effusion and bilateral pleural effusions.  His CRP was still severely elevated on February 5 at 130 (up from 22.5 in January), but by February 12 his CRP was down to 22.5 and by February 15 his sed rate had decreased to 21 (down from 69).  He is still on colchicine but is no longer taking ibuprofen.  He is  scheduled to have repeat ESR, CRP and a chest CT on Monday of next week at [facility], through his PCPs office.  He had an elevated PSA of 14 and is following up with his urologist.  It is not clear if the elevation was due to riding his bicycle, inflammatory changes or whether he needs additional work-up for his prostate.  There was no evidence of any malignancy on his chest CT or bone imaging studies.  The patient does not have symptoms concerning for COVID-19 infection (fever, chills, cough, or new shortness of breath).    No past medical history on file. Past Surgical History:  Procedure Laterality Date  . HERNIA REPAIR    . PERICARDIOCENTESIS N/A 09/03/2019   Procedure: PERICARDIOCENTESIS;  Surgeon: Burnell Blanks, MD;  Location: [facility];  Service: Cardiovascular;  Laterality: N/A;     Current Meds  Medication Sig  . albuterol (VENTOLIN HFA) 108 (90 Base) MCG/ACT inhaler Inhale into the lungs every 6 (six) hours as needed for wheezing or shortness of breath.  . colchicine 0.6 MG tablet Take 1 tablet (0.6 mg total) by mouth daily.  . furosemide (LASIX) 20 MG tablet Take 20 mg by mouth daily.  . pantoprazole (PROTONIX) 40 MG tablet Take 1 tablet (40 mg total) by mouth daily.     Allergies:   Patient has no known allergies.   Social History   Tobacco Use  . Smoking status: Never Smoker  . Smokeless tobacco: Never Used  Substance Use Topics  . Alcohol use:  Not Currently    Comment: occ  . Drug use: Not on file     Family Hx: The patient's family history is not on file.  ROS:   Please see the history of present illness.    All other systems reviewed and are negative.   Prior CV studies:   The following studies were reviewed today: Labs and notes in care everywhere  Labs/Other Tests and Data Reviewed:    EKG:  No ECG reviewed.  Recent Labs: 09/02/2019: ALT 37 09/14/2019: B Natriuretic Peptide 32.1; BUN 14; Creatinine, Ser 1.08; Hemoglobin  12.2; Platelets 263; Potassium 4.0; Sodium 137   Recent Lipid Panel No results found for: CHOL, TRIG, HDL, CHOLHDL, LDLCALC, LDLDIRECT  Wt Readings from Last 3 Encounters:  10/23/19 178 lb (80.7 kg)  09/27/19 181 lb (82.1 kg)  09/04/19 184 lb 1.4 oz (83.5 kg)     Objective:    Vital Signs:  BP (!) 143/98   Pulse 68   Ht 5' 9"  (1.753 m)   Wt 178 lb (80.7 kg)   BMI 26.29 kg/m    VITAL SIGNS:  reviewed GEN:  no acute distress EYES:  sclerae anicteric, EOMI - Extraocular Movements Intact RESPIRATORY:  normal respiratory effort, symmetric expansion CARDIOVASCULAR:  no peripheral edema SKIN:  no rash, lesions or ulcers. MUSCULOSKELETAL:  no obvious deformities. NEURO:  alert and oriented x 3, no obvious focal deficit PSYCH:  normal affect  ASSESSMENT & PLAN:    1. Acute pericarditis/pleuritis, complicated by pericardial tamponade.  This appears to be resolving based on clinical symptoms and improving inflammatory markers.  He should continue colchicine for a total of 90 days (through mid April).  No evidence of ongoing inflammation, hopefully this will also be confirmed by his labs next week.  Most likely diagnosis remains viral syndrome.  Note that he was COVID-19 negative on initial presentation.  I will follow up on his test through care everywhere, but doubt that he will need long-term cardiology follow-up. 2. Elevated PSA: Level of 14 is concerning and he should follow-up with his urologist.  It might just be posttraumatic/postinflammatory.  COVID-19 Education: The signs and symptoms of COVID-19 were discussed with the patient and how to seek care for testing (follow up with PCP or arrange E-visit).  The importance of social distancing was discussed today.  Time:   Today, I have spent 16 minutes with the patient with telehealth technology discussing the above problems.     Medication Adjustments/Labs and Tests Ordered: Current medicines are reviewed at length with the  patient today.  Concerns regarding medicines are outlined above.   Tests Ordered: No orders of the defined types were placed in this encounter.   Medication Changes: No orders of the defined types were placed in this encounter.   Follow Up:    prn  Signed, Sanda Klein, MD  10/23/2019 10:17 AM    [place]

## 2019-10-23 NOTE — Patient Instructions (Signed)
Medication Instructions:  STOP the Colchicine April 20 th  *If you need a refill on your cardiac medications before your next appointment, please call your pharmacy*   Lab Work: None ordered If you have labs (blood work) drawn today and your tests are completely normal, you will receive your results only by: Marland Kitchen MyChart Message (if you have MyChart) OR . A paper copy in the mail If you have any lab test that is abnormal or we need to change your treatment, we will call you to review the results.   Testing/Procedures: None ordered   Follow-Up: At [facility], you and your health needs are our priority.  As part of our continuing mission to provide you with exceptional heart care, we have created designated Provider Care Teams.  These Care Teams include your primary Cardiologist (physician) and Advanced Practice Providers (APPs -  Physician Assistants and Nurse Practitioners) who all work together to provide you with the care you need, when you need it.  We recommend signing up for the patient portal called "MyChart".  Sign up information is provided on this After Visit Summary.  MyChart is used to connect with patients for Virtual Visits (Telemedicine).  Patients are able to view lab/test results, encounter notes, upcoming appointments, etc.  Non-urgent messages can be sent to your provider as well.   To learn more about what you can do with MyChart, go to [email or web address].    Your next appointment:   Follow up as needed with Dr. Royann Shivers.

## 2022-01-11 IMAGING — DX DG CHEST 1V PORT
1 series · 1 of 1 positions shown · non-contrast
Comparison: September 02, 2019

CLINICAL DATA: Chest pain with breathing.

EXAM:
PORTABLE CHEST 1 VIEW

[chest ap]
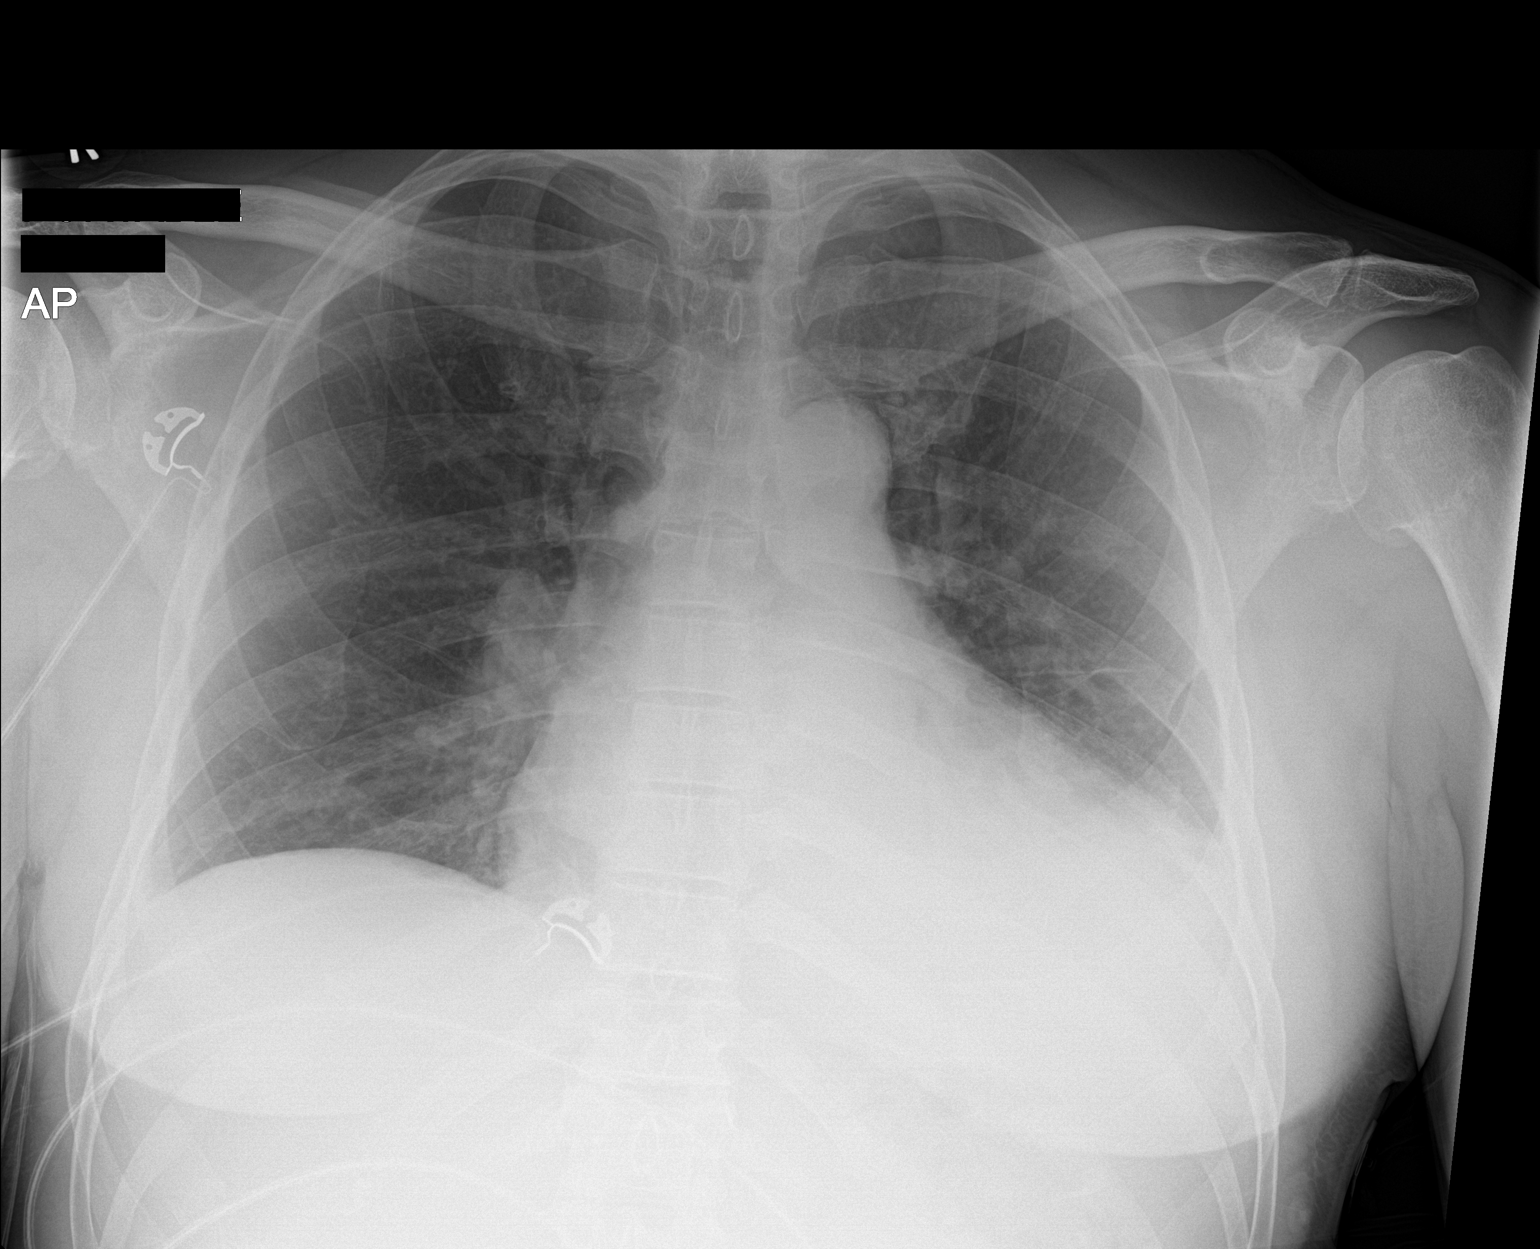

[1 of 1 positions shown; findings below may reference images not displayed]

FINDINGS: Mild atelectasis and/or infiltrate is seen within the left lung
base. This is decreased in severity when compared to the prior
study.

A small left pleural effusion is noted.

No pneumothorax is identified.

The cardiac silhouette is markedly enlarged unchanged in size.

The visualized skeletal structures are unremarkable.
IMPRESSION: 1. Mild left basilar atelectasis and/or infiltrate, decreased in
severity when compared to the prior study dated [DATE] [DATE], [DATE].
[DATE]. Small left pleural effusion.

## 2022-10-17 ENCOUNTER — Other Ambulatory Visit: Payer: Self-pay | Admitting: Orthopedic Surgery

## 2022-10-17 DIAGNOSIS — M87811 Other osteonecrosis, right shoulder: Secondary | ICD-10-CM

## 2023-05-15 ENCOUNTER — Ambulatory Visit
Admission: RE | Admit: 2023-05-15 | Discharge: 2023-05-15 | Disposition: A | Discharge status: Home / Self Care | Source: Ambulatory Visit | Attending: Orthopedic Surgery | Admitting: Orthopedic Surgery

## 2023-05-15 DIAGNOSIS — M87811 Other osteonecrosis, right shoulder: Secondary | ICD-10-CM
